# Patient Record
Sex: Female | Born: 1970
Health system: Southern US, Community
[De-identification: ages and names within clinical notes are randomized; demographics above are authoritative.]

## PROBLEM LIST (undated history)

## (undated) DIAGNOSIS — E063 Autoimmune thyroiditis: Secondary | ICD-10-CM

## (undated) DIAGNOSIS — I10 Essential (primary) hypertension: Secondary | ICD-10-CM

## (undated) DIAGNOSIS — O24419 Gestational diabetes mellitus in pregnancy, unspecified control: Secondary | ICD-10-CM

## (undated) DIAGNOSIS — R7303 Prediabetes: Secondary | ICD-10-CM

## (undated) DIAGNOSIS — R51 Headache: Secondary | ICD-10-CM

## (undated) DIAGNOSIS — E049 Nontoxic goiter, unspecified: Secondary | ICD-10-CM

## (undated) DIAGNOSIS — D219 Benign neoplasm of connective and other soft tissue, unspecified: Secondary | ICD-10-CM

## (undated) DIAGNOSIS — N6320 Unspecified lump in the left breast, unspecified quadrant: Secondary | ICD-10-CM

## (undated) DIAGNOSIS — Z8619 Personal history of other infectious and parasitic diseases: Secondary | ICD-10-CM

## (undated) DIAGNOSIS — N6310 Unspecified lump in the right breast, unspecified quadrant: Secondary | ICD-10-CM

## (undated) HISTORY — DX: Autoimmune thyroiditis: E06.3

## (undated) HISTORY — DX: Unspecified lump in the left breast, unspecified quadrant: N63.20

## (undated) HISTORY — DX: Personal history of other infectious and parasitic diseases: Z86.19

## (undated) HISTORY — DX: Essential (primary) hypertension: I10

## (undated) HISTORY — PX: LASIK: SHX215

## (undated) HISTORY — DX: Prediabetes: R73.03

## (undated) HISTORY — DX: Unspecified lump in the right breast, unspecified quadrant: N63.10

## (undated) HISTORY — DX: Benign neoplasm of connective and other soft tissue, unspecified: D21.9

## (undated) HISTORY — DX: Nontoxic goiter, unspecified: E04.9

## (undated) SURGERY — Surgical Case
Anesthesia: *Unknown

---

## 1997-11-30 ENCOUNTER — Other Ambulatory Visit: Admission: RE | Admit: 1997-11-30 | Discharge: 1997-11-30 | Payer: Self-pay | Admitting: Obstetrics and Gynecology

## 1997-12-29 ENCOUNTER — Other Ambulatory Visit: Admission: RE | Admit: 1997-12-29 | Discharge: 1997-12-29 | Payer: Self-pay | Admitting: Obstetrics & Gynecology

## 1997-12-30 ENCOUNTER — Inpatient Hospital Stay (HOSPITAL_COMMUNITY): Admission: AD | Admit: 1997-12-30 | Discharge: 1998-01-01 | Payer: Self-pay | Admitting: Obstetrics & Gynecology

## 1998-01-04 ENCOUNTER — Encounter: Admission: RE | Admit: 1998-01-04 | Discharge: 1998-04-04 | Payer: Self-pay | Admitting: *Deleted

## 1998-06-02 ENCOUNTER — Other Ambulatory Visit: Admission: RE | Admit: 1998-06-02 | Discharge: 1998-06-02 | Payer: Self-pay | Admitting: *Deleted

## 1999-05-24 ENCOUNTER — Other Ambulatory Visit: Admission: RE | Admit: 1999-05-24 | Discharge: 1999-05-24 | Payer: Self-pay | Admitting: Obstetrics & Gynecology

## 2000-06-09 ENCOUNTER — Other Ambulatory Visit: Admission: RE | Admit: 2000-06-09 | Discharge: 2000-06-09 | Payer: Self-pay | Admitting: Obstetrics & Gynecology

## 2001-06-09 ENCOUNTER — Other Ambulatory Visit: Admission: RE | Admit: 2001-06-09 | Discharge: 2001-06-09 | Payer: Self-pay | Admitting: Obstetrics and Gynecology

## 2002-06-15 ENCOUNTER — Other Ambulatory Visit: Admission: RE | Admit: 2002-06-15 | Discharge: 2002-06-15 | Payer: Self-pay | Admitting: Obstetrics and Gynecology

## 2003-02-16 ENCOUNTER — Encounter: Payer: Self-pay | Admitting: Obstetrics and Gynecology

## 2003-02-16 ENCOUNTER — Ambulatory Visit (HOSPITAL_COMMUNITY): Admission: RE | Admit: 2003-02-16 | Discharge: 2003-02-16 | Payer: Self-pay | Admitting: Obstetrics and Gynecology

## 2003-06-17 ENCOUNTER — Other Ambulatory Visit: Admission: RE | Admit: 2003-06-17 | Discharge: 2003-06-17 | Payer: Self-pay | Admitting: Obstetrics and Gynecology

## 2003-08-20 DIAGNOSIS — O24419 Gestational diabetes mellitus in pregnancy, unspecified control: Secondary | ICD-10-CM

## 2003-08-20 HISTORY — DX: Gestational diabetes mellitus in pregnancy, unspecified control: O24.419

## 2003-09-01 ENCOUNTER — Inpatient Hospital Stay (HOSPITAL_COMMUNITY): Admission: AD | Admit: 2003-09-01 | Discharge: 2003-09-01 | Payer: Self-pay | Admitting: Obstetrics and Gynecology

## 2003-09-09 ENCOUNTER — Ambulatory Visit (HOSPITAL_COMMUNITY): Admission: RE | Admit: 2003-09-09 | Discharge: 2003-09-09 | Payer: Self-pay | Admitting: Obstetrics and Gynecology

## 2004-03-20 ENCOUNTER — Encounter: Admission: RE | Admit: 2004-03-20 | Discharge: 2004-04-27 | Payer: Self-pay | Admitting: Obstetrics and Gynecology

## 2004-03-29 ENCOUNTER — Ambulatory Visit (HOSPITAL_COMMUNITY): Admission: RE | Admit: 2004-03-29 | Discharge: 2004-03-29 | Payer: Self-pay | Admitting: Obstetrics and Gynecology

## 2004-04-26 ENCOUNTER — Inpatient Hospital Stay (HOSPITAL_COMMUNITY): Admission: AD | Admit: 2004-04-26 | Discharge: 2004-04-29 | Payer: Self-pay | Admitting: Obstetrics and Gynecology

## 2004-05-24 ENCOUNTER — Encounter: Admission: RE | Admit: 2004-05-24 | Discharge: 2004-05-24 | Payer: Self-pay | Admitting: Obstetrics and Gynecology

## 2004-05-30 DIAGNOSIS — N6310 Unspecified lump in the right breast, unspecified quadrant: Secondary | ICD-10-CM

## 2004-05-30 HISTORY — DX: Unspecified lump in the right breast, unspecified quadrant: N63.10

## 2004-06-18 ENCOUNTER — Other Ambulatory Visit: Admission: RE | Admit: 2004-06-18 | Discharge: 2004-06-18 | Payer: Self-pay | Admitting: Obstetrics and Gynecology

## 2004-06-27 ENCOUNTER — Ambulatory Visit: Payer: Self-pay | Admitting: Family Medicine

## 2004-06-28 ENCOUNTER — Ambulatory Visit: Payer: Self-pay | Admitting: Family Medicine

## 2004-08-23 ENCOUNTER — Encounter: Admission: RE | Admit: 2004-08-23 | Discharge: 2004-08-23 | Payer: Self-pay | Admitting: Obstetrics and Gynecology

## 2004-11-08 ENCOUNTER — Ambulatory Visit: Payer: Self-pay | Admitting: Family Medicine

## 2004-12-04 ENCOUNTER — Ambulatory Visit: Payer: Self-pay | Admitting: Family Medicine

## 2005-01-04 ENCOUNTER — Ambulatory Visit: Payer: Self-pay | Admitting: "Endocrinology

## 2005-04-16 ENCOUNTER — Ambulatory Visit: Payer: Self-pay | Admitting: "Endocrinology

## 2005-06-26 ENCOUNTER — Other Ambulatory Visit: Admission: RE | Admit: 2005-06-26 | Discharge: 2005-06-26 | Payer: Self-pay | Admitting: Obstetrics and Gynecology

## 2005-12-31 ENCOUNTER — Ambulatory Visit: Payer: Self-pay | Admitting: "Endocrinology

## 2006-04-01 ENCOUNTER — Ambulatory Visit: Payer: Self-pay | Admitting: Internal Medicine

## 2006-07-08 ENCOUNTER — Other Ambulatory Visit: Admission: RE | Admit: 2006-07-08 | Discharge: 2006-07-08 | Payer: Self-pay | Admitting: Obstetrics and Gynecology

## 2006-07-08 DIAGNOSIS — N6320 Unspecified lump in the left breast, unspecified quadrant: Secondary | ICD-10-CM

## 2006-07-08 HISTORY — DX: Unspecified lump in the left breast, unspecified quadrant: N63.20

## 2006-07-15 ENCOUNTER — Encounter: Admission: RE | Admit: 2006-07-15 | Discharge: 2006-07-15 | Payer: Self-pay | Admitting: Obstetrics and Gynecology

## 2006-09-09 ENCOUNTER — Ambulatory Visit: Payer: Self-pay | Admitting: "Endocrinology

## 2007-04-15 ENCOUNTER — Ambulatory Visit: Payer: Self-pay | Admitting: "Endocrinology

## 2007-05-28 ENCOUNTER — Ambulatory Visit: Payer: Self-pay | Admitting: Internal Medicine

## 2007-05-28 DIAGNOSIS — O99810 Abnormal glucose complicating pregnancy: Secondary | ICD-10-CM | POA: Insufficient documentation

## 2007-05-28 DIAGNOSIS — I10 Essential (primary) hypertension: Secondary | ICD-10-CM | POA: Insufficient documentation

## 2007-06-01 LAB — CONVERTED CEMR LAB
ALT: 11 units/L (ref 0–35)
AST: 20 units/L (ref 0–37)
Albumin: 4.1 g/dL (ref 3.5–5.2)
Alkaline Phosphatase: 41 units/L (ref 39–117)
BUN: 9 mg/dL (ref 6–23)
Basophils Absolute: 0 10*3/uL (ref 0.0–0.1)
Basophils Relative: 0.2 % (ref 0.0–1.0)
Bilirubin, Direct: 0.1 mg/dL (ref 0.0–0.3)
CO2: 29 meq/L (ref 19–32)
Calcium: 9.3 mg/dL (ref 8.4–10.5)
Chloride: 105 meq/L (ref 96–112)
Cholesterol: 200 mg/dL (ref 0–200)
Creatinine, Ser: 0.4 mg/dL (ref 0.4–1.2)
Eosinophils Absolute: 0.1 10*3/uL (ref 0.0–0.6)
Eosinophils Relative: 2 % (ref 0.0–5.0)
Free T4: 0.7 ng/dL (ref 0.6–1.6)
GFR calc Af Amer: 232 mL/min
GFR calc non Af Amer: 192 mL/min
Glucose, Bld: 96 mg/dL (ref 70–99)
HCT: 37.4 % (ref 36.0–46.0)
HDL: 68.5 mg/dL (ref >39.0)
Hemoglobin: 12.8 g/dL (ref 12.0–15.0)
LDL Cholesterol: 115 mg/dL — ABNORMAL HIGH (ref 0–99)
Lymphocytes Relative: 45.7 % (ref 12.0–46.0)
MCHC: 34.3 g/dL (ref 30.0–36.0)
MCV: 91.2 fL (ref 78.0–100.0)
Monocytes Absolute: 0.4 10*3/uL (ref 0.2–0.7)
Monocytes Relative: 7.9 % (ref 3.0–11.0)
Neutro Abs: 2.2 10*3/uL (ref 1.4–7.7)
Neutrophils Relative %: 44.2 % (ref 43.0–77.0)
Platelets: 283 10*3/uL (ref 150–400)
Potassium: 4.5 meq/L (ref 3.5–5.1)
RBC: 4.11 M/uL (ref 3.87–5.11)
RDW: 11.8 % (ref 11.5–14.6)
Sodium: 140 meq/L (ref 135–145)
T3, Free: 2.9 pg/mL (ref 2.3–4.2)
TSH: 3.23 microintl units/mL (ref 0.35–5.50)
Total Bilirubin: 0.6 mg/dL (ref 0.3–1.2)
Total CHOL/HDL Ratio: 2.9
Total Protein: 7.5 g/dL (ref 6.0–8.3)
Triglycerides: 83 mg/dL (ref 0–149)
VLDL: 17 mg/dL (ref 0–40)
WBC: 4.9 10*3/uL (ref 4.5–10.5)

## 2007-07-27 LAB — CONVERTED CEMR LAB: Pap Smear: NORMAL

## 2007-12-17 ENCOUNTER — Ambulatory Visit: Payer: Self-pay | Admitting: "Endocrinology

## 2008-05-10 ENCOUNTER — Ambulatory Visit: Payer: Self-pay | Admitting: "Endocrinology

## 2008-06-06 ENCOUNTER — Ambulatory Visit: Payer: Self-pay | Admitting: Internal Medicine

## 2008-08-17 ENCOUNTER — Encounter: Payer: Self-pay | Admitting: Internal Medicine

## 2008-08-23 ENCOUNTER — Encounter: Payer: Self-pay | Admitting: Internal Medicine

## 2008-09-01 ENCOUNTER — Ambulatory Visit: Payer: Self-pay | Admitting: "Endocrinology

## 2009-02-28 ENCOUNTER — Ambulatory Visit: Payer: Self-pay | Admitting: "Endocrinology

## 2009-08-21 DIAGNOSIS — D219 Benign neoplasm of connective and other soft tissue, unspecified: Secondary | ICD-10-CM

## 2009-08-21 HISTORY — DX: Benign neoplasm of connective and other soft tissue, unspecified: D21.9

## 2009-09-05 ENCOUNTER — Ambulatory Visit: Payer: Self-pay | Admitting: "Endocrinology

## 2010-03-05 ENCOUNTER — Ambulatory Visit: Payer: Self-pay | Admitting: "Endocrinology

## 2010-06-08 ENCOUNTER — Ambulatory Visit: Payer: Self-pay | Admitting: Internal Medicine

## 2010-06-12 LAB — CONVERTED CEMR LAB
ALT: 11 units/L (ref 0–35)
AST: 22 units/L (ref 0–37)
Albumin: 4.3 g/dL (ref 3.5–5.2)
Alkaline Phosphatase: 50 units/L (ref 39–117)
BUN: 13 mg/dL (ref 6–23)
Basophils Absolute: 0.1 10*3/uL (ref 0.0–0.1)
Basophils Relative: 1.1 % (ref 0.0–3.0)
Bilirubin, Direct: 0.1 mg/dL (ref 0.0–0.3)
CO2: 28 meq/L (ref 19–32)
Calcium: 9.6 mg/dL (ref 8.4–10.5)
Chloride: 103 meq/L (ref 96–112)
Cholesterol: 234 mg/dL — ABNORMAL HIGH (ref 0–200)
Creatinine, Ser: 0.8 mg/dL (ref 0.4–1.2)
Direct LDL: 138.5 mg/dL
Eosinophils Absolute: 0.1 10*3/uL (ref 0.0–0.7)
Eosinophils Relative: 2 % (ref 0.0–5.0)
GFR calc non Af Amer: 103.77 mL/min (ref >60)
Glucose, Bld: 86 mg/dL (ref 70–99)
HCT: 38.3 % (ref 36.0–46.0)
HDL: 77.3 mg/dL (ref >39.00)
Hemoglobin: 13.1 g/dL (ref 12.0–15.0)
Lymphocytes Relative: 40.7 % (ref 12.0–46.0)
Lymphs Abs: 2.1 10*3/uL (ref 0.7–4.0)
MCHC: 34.2 g/dL (ref 30.0–36.0)
MCV: 91.8 fL (ref 78.0–100.0)
Monocytes Absolute: 0.4 10*3/uL (ref 0.1–1.0)
Monocytes Relative: 6.9 % (ref 3.0–12.0)
Neutro Abs: 2.6 10*3/uL (ref 1.4–7.7)
Neutrophils Relative %: 49.3 % (ref 43.0–77.0)
Platelets: 336 10*3/uL (ref 150.0–400.0)
Potassium: 4.6 meq/L (ref 3.5–5.1)
RBC: 4.17 M/uL (ref 3.87–5.11)
RDW: 12.9 % (ref 11.5–14.6)
Sodium: 139 meq/L (ref 135–145)
Total Bilirubin: 0.5 mg/dL (ref 0.3–1.2)
Total CHOL/HDL Ratio: 3
Total Protein: 7.6 g/dL (ref 6.0–8.3)
Triglycerides: 104 mg/dL (ref 0.0–149.0)
VLDL: 20.8 mg/dL (ref 0.0–40.0)
WBC: 5.2 10*3/uL (ref 4.5–10.5)

## 2010-07-09 ENCOUNTER — Ambulatory Visit: Payer: Self-pay | Admitting: "Endocrinology

## 2010-09-03 ENCOUNTER — Ambulatory Visit (HOSPITAL_BASED_OUTPATIENT_CLINIC_OR_DEPARTMENT_OTHER)
Admission: RE | Admit: 2010-09-03 | Discharge: 2010-09-03 | Payer: Self-pay | Source: Home / Self Care | Attending: Internal Medicine | Admitting: Internal Medicine

## 2010-09-03 LAB — HM MAMMOGRAPHY: HM Mammogram: NEGATIVE

## 2010-09-16 LAB — CONVERTED CEMR LAB
Free T4: 0.77 ng/dL
T3, Free: 2.4 pg/mL

## 2010-09-18 NOTE — Assessment & Plan Note (Signed)
Summary: cpx//pt will be fasting//lch   Vital Signs:  Patient profile:   40 year old female Height:      64 inches Weight:      153.38 pounds BMI:     26.42 Pulse rate:   74 / minute Pulse rhythm:   regular BP sitting:   124 / 86  (left arm) Cuff size:   regular  Vitals Entered By: Army Fossa CMA (June 08, 2010 8:49 AM) CC: CPX, fasting  Comments pharm- [facility]    History of Present Illness: CPX  Preventive Screening-Counseling & Management  Caffeine-Diet-Exercise     Does Patient Exercise: yes  Current Medications (verified): 1)  Tri-Sprintec 0.035 Mg  Tabs (Norgestimate-Ethinyl Estradiol) 2)  Nasonex 50 Mcg/act Susp (Mometasone Furoate)  Allergies (verified): No Known Drug Allergies  Past History:  Past Medical History: G3 P2 miscarriage x 1 uterine fibroid tumors 08-2009 h/o Hyperthyroidism h/o Hypertension, on diet only  LASIK 2009  Past Surgical History: LASIK  Family History: Reviewed history from 06/06/2008 and no changes required. colon ca--no breast ca--no DM--GM, aunt MI: gf at 84 y/o  Social History: Married 2 kids tobacco--no ETOH--no Occupation Special educational needs teacher-- low salt, not as well as she likes , thinking about [employer] exercises when she can,  at least 1 or times a week  Does Patient Exercise:  yes  Review of Systems General:  Denies fever and weight loss. CV:  Denies chest pain or discomfort and swelling of feet; h/o HTN, diet only, ambulatory BPs wnl . Resp:  Denies cough and shortness of breath. GI:  Denies bloody stools, nausea, and vomiting. GU:  Denies dysuria and hematuria. Psych:  Denies anxiety and depression.  Physical Exam  General:  alert, well-developed, and well-nourished.   Neck:  no masses, no thyromegaly, and normal carotid upstroke.   Lungs:  normal respiratory effort, no intercostal retractions, no accessory muscle use, and normal breath sounds.   Heart:  normal rate, regular rhythm, no murmur,  and no gallop.   Abdomen:  soft, non-tender, no distention, no masses, no guarding, and no rigidity.   Extremities:  no pretibial edema bilaterally  Psych:  Cognition and judgment appear intact. Alert and cooperative with normal attention span and concentration.  not anxious appearing and not depressed appearing.     Impression & Recommendations:  Problem # 1:  HEALTH SCREENING (ICD-V70.0)  Td 2002 Sees Gyn, they do PAPs, MMGs thyroid problem is followup by endocrinology counseling about diet and exercise, she is thinking about Weight Watchers. Labs Followup in one or 2 years, she is very healthy and sees other doctors  Orders: Venipuncture (16109) TLB-BMP (Basic Metabolic Panel-BMET) (80048-METABOL) TLB-CBC Platelet - w/Differential (85025-CBCD) TLB-Lipid Panel (80061-LIPID) TLB-Hepatic/Liver Function Pnl (80076-HEPATIC) Specimen Handling (60454)  Orders: Venipuncture (09811) TLB-BMP (Basic Metabolic Panel-BMET) (80048-METABOL) TLB-CBC Platelet - w/Differential (85025-CBCD) TLB-Lipid Panel (80061-LIPID) TLB-Hepatic/Liver Function Pnl (80076-HEPATIC) Specimen Handling (91478)  Problem # 2:  HYPERTENSION (ICD-401.9) history of elevated blood pressure, managing with diet only, ambulatory BPs within normal. Doing well  Complete Medication List: 1)  Tri-sprintec 0.035 Mg Tabs (Norgestimate-ethinyl estradiol) 2)  Nasonex 50 Mcg/act Susp (Mometasone furoate)  Patient Instructions: 1)  Please schedule a follow-up appointment in 1 or 2  years   Orders Added: 1)  Venipuncture [36415] 2)  TLB-BMP (Basic Metabolic Panel-BMET) [80048-METABOL] 3)  TLB-CBC Platelet - w/Differential [85025-CBCD] 4)  TLB-Lipid Panel [80061-LIPID] 5)  TLB-Hepatic/Liver Function Pnl [80076-HEPATIC] 6)  Specimen Handling [99000] 7)  Est. Patient age 9-39 [phone]  Immunization History:  Influenza Immunization History:    Influenza:  historical (05/25/2010)   Immunization  History:  Influenza Immunization History:    Influenza:  Historical (05/25/2010)    Risk Factors:  Exercise:  yes

## 2010-12-10 ENCOUNTER — Encounter: Payer: Self-pay | Admitting: *Deleted

## 2011-01-03 ENCOUNTER — Encounter: Payer: Self-pay | Admitting: "Endocrinology

## 2011-01-03 ENCOUNTER — Ambulatory Visit (INDEPENDENT_AMBULATORY_CARE_PROVIDER_SITE_OTHER): Payer: Managed Care, Other (non HMO) | Admitting: "Endocrinology

## 2011-01-03 VITALS — BP 122/84 | HR 71 | Wt 157.0 lb

## 2011-01-03 DIAGNOSIS — E063 Autoimmune thyroiditis: Secondary | ICD-10-CM | POA: Insufficient documentation

## 2011-01-03 DIAGNOSIS — E049 Nontoxic goiter, unspecified: Secondary | ICD-10-CM

## 2011-01-03 DIAGNOSIS — E0580 Other thyrotoxicosis without thyrotoxic crisis or storm: Secondary | ICD-10-CM | POA: Insufficient documentation

## 2011-01-03 DIAGNOSIS — E038 Other specified hypothyroidism: Secondary | ICD-10-CM

## 2011-01-03 DIAGNOSIS — I10 Essential (primary) hypertension: Secondary | ICD-10-CM

## 2011-01-03 MED ORDER — LISINOPRIL 2.5 MG PO TABS: 2.5000 mg | ORAL_TABLET | Freq: Every day | ORAL | Status: DC

## 2011-01-03 NOTE — Patient Instructions (Signed)
Please check BP at work several times per week for the next two weeks. If the diastolic (lower) BP remains above 80, please start lisinopril , 2.5 mg table once a day. Please make a folw-up appointment with Dr. Drue Novel for BP checks.

## 2011-01-03 NOTE — Progress Notes (Signed)
CC: FU thyroiditis, goiter, hyperthyroidism due to flare of thyroiditis, hypothyroidism due to flare of thyroiditis, hypertension  HPI: 40 y.o. African-American woman 1. Wanda Herman has a history of episodically being hyperthyroid or hypothyroid due to flare-ups of Hashimoto's Thyroiditis.   A. I first saw her in consultation on 05.19.06. She had had some hyperthyroid symptoms in the preceding months and had reported them to her then PCP, Dr. Ruthine Dose. Dr. Ruthine Dose ordered TFTs on 04.18.06. The TSH was low at 0.1, the Free T4 was low at 0.7, and the Free T3 was normal at 2.6. Since these results were confusing, Dr. Ruthine Dose referred Wanda Herman to me.  B. She had delivered her second child the preceding September, so she expected to feel as tired as she felt. FH was positive for a first cousin who was taking medication for some thyroid disorder. On exam she looked quite healthy. Her heart rate was 80. She had a 25 gram goiter, with normal being 15-20 grams. She also had trace palmar erythema and 1+ moist palms. She appeared clinically euthyroid, except perhaps for the palmar signs, which might indicate hyperthyroidism or be non-specific. As it turned out, her TFTs that day were euthyroid, with TSH 1.612, FT4 0.99, and FT3 2.4. Her TPO antibody was 113.3, with lab normal < 60 (and real normal <40). The elevated TPO antibody indicated the autoimmune activation of ongoing Hashimoto's Thyroiditis.  C. In the subsequent 6 years I've seen [name] in clinic about once every 6 months. Although she has been euthyroid on most visits, she has also had periods of hyperthyroidism and hypothyroidism associated with flares of Hashimoto's Thyroiditis. She has never required treatment with thionamide medications and is not yet ready to start Synthroid.  2. Her last [facility] visit was on 11.21.11. In the interim she has remained healthy, except for some seasonal allergy symptoms. She takes Zyrtec, prn, and oral contraceptives  regularly. 3.PROS: Constitutional: The patient feels well, is healthy, and has no significant complaints. Eyes: Her vision has been good since her lasik surgery three years ago. There are no significant eye complaints. Neck: The patient has no complaints of anterior neck swelling, soreness, tenderness,  pressure, discomfort, or difficulty swallowing.  Heart: Heart rate increases with exercise or other physical activity. The patient has no complaints of palpitations, irregular heat beats, chest pain, or chest pressure. Gastrointestinal: She will occasionally notice that her stomach feels upset. Bowel movents seem normal. She has no other GI complaints. Legs: Muscle mass and strength seem normal. There are no complaints of numbness, tingling, burning, or pain. No edema is noted. Feet: There are no obvious foot problems. There are no complaints of numbness, tingling, burning, or pain. No edema is noted. GYN: Her LMP began on 04.27.12.  PMFSH: 1. Her marriage and job are going well. 2. She recently resumed exercising again.  ROS: [name] had no other significant complaints involving any of her other eleven body systems.  PHYSICAL EXAM: BP 122/84  Pulse 71  Wt 157 lb (71.215 kg) Constitutional: The patient looks healthy and appears physically and emotionally well.  Eyes: There is no arcus or proptosis.  Mouth: The oropharynx appears normal. The tongue appears normal. There is normal oral moisture. There is no obvious gingivitis. Neck: There are no bruits present. The thyroid gland appears somewhat enlarged in size. The thyroid gland is approximately 20-25 grams in size. The consistency of the thyroid gland is normal. There is no thyroid tenderness to palpation. Lungs: The lungs are  clear. Air movement is good. Heart: The heart rhythm and rate appear normal. Heart sounds S1 and S2 are normal. I do not appreciate any pathologic heart murmurs. Abdomen: The abdominal size is normal. Bowel  sounds are normal. The abdomen is soft and non-tender. There is no obviously palpable hepatomegaly, splenomegaly, or other masses.  Arms: Muscle mass appears appropriate for age.  Hands: There is no obvious tremor. Phalangeal and metacarpophalangeal joints appear normal. Palms are normal. Legs: Muscle mass appears appropriate for age. There is no edema.   Neurologic: Muscle strength is normal for age and gender  in both the upper and the lower extremities. Muscle tone appears normal. Sensation to touch is normal in the legs and feet.   Labs 05.04.12: Normal TFTs (TSH 1.72, FT4 1.03, FT3 2.8), slightly elevated TPO antibody (63)  ASSESSMENT: 1. Goiter: the thyroid gland remains stable in size. 2. Hypothyroid: She is euthyroid without needing medication at present. 3. Thyroiditis: Her Hashimoto's disease is clinically quiescent. 4. Hypertension: Her BPs is somewhat higher despite resuming exercise. I started her on a low dose of lisinopril, 2.5 mg per day. I asked her to follow-up with Dr. Drue Novel in one month to have her BP re-checked.  PLAN: 1. Diagnostic:  A. Lab Tests: TFTs in 6 months.   B. Imaging studies: None 2. Therapeutic:   A. Take one 2.5 mg tablet of lisinopril each morning. Call me if she develops cough or facial swelling.  B. Schedule a FU appointment with Dr. Drue Novel in about one month to have the BP re-checked. 3. Patient education: We discussed how Hashimoto's Dz might make her hypothyroid at some time in the future. 4. Follow-Up: FU appointment in 6 months

## 2011-01-04 NOTE — H&P (Signed)
NAME:  Wanda Herman, Wanda Herman                      ACCOUNT NO.:  [id number]   MEDICAL RECORD NO.:  [id number]                   PATIENT TYPE:  INP   LOCATION:  9113                                 FACILITY:  [facility]   PHYSICIAN:  Osborn Coho, M.D.                DATE OF BIRTH:  08/11/1971   DATE OF ADMISSION:  04/26/2004  DATE OF DISCHARGE:                                HISTORY & PHYSICAL   HISTORY OF PRESENT ILLNESS:  This is a 40 year old, gravida 3, para 1-0-1-1,  at 75 and 3/7ths weeks who presents with contractions every 5 minutes.  She  denies leaking and reports positive fetal movement and peak mucous  discharge.  The pregnancy has been remarkable for (1) history of migraines,  (2) gestational diabetes, (3) group B strep negative.   OBSTETRICAL HISTORY:  Remarkable for a vaginal delivery in 1999 of a female  infant at [redacted] weeks gestation weighing 7 pounds, 14 ounces, remarkable for  meconium fluid and 5 hour labor.  She had a spontaneous abortion in 2004 at  6 weeks with no D&C.   MEDICAL HISTORY:  1.  Remarkable for occasional yeast.  2.  Childhood varicella.  3.  Migraines.   SURGICAL HISTORY:  Remarkable for wisdom teeth at age 39.   FAMILY HISTORY:  Remarkable for mother with hypertension.  Grandmother and  an aunt with diabetes.  Cousin with thyroid dysfunction.  An aunt with  ovarian cancer.  A mother with migraines.   GENETIC HISTORY:  Remarkable for the father of the baby's uncle who has Down  syndrome.   SOCIAL HISTORY:  The patient is married to Sheryle Spray, who is involved  and supportive.  She works as an Airline pilot.  She is of the [place] faith.  She denies any alcohol, tobacco, or drug use.   PRENATAL LABORATORIES:  Hemoglobin 12.8, platelets 308, blood type O  positive, antibody screen negative, sickle cell negative, RPR nonreactive,  rubella immune.  Hepatitis negative.  HIV nonreactive.  Gonorrhea negative.  Chlamydia negative.  Cystic fibrosis  negative.  Group B strep negative.   OBJECTIVE DATA:  VITAL SIGNS:  Stable, afebrile.  HEENT:  Well-nourished.  Thyroid normal and not enlarged.  CHEST:  Clear to auscultation.  HEART:  Regular rate and rhythm.  ABDOMEN:  Gravid at 38 cm, vertex, Leopold's.  EFM shows reactive fetal  heart rate with uterine contractions every 4-5 minutes.  Cervical exam was  initially 2, 85, and -2.  She is now 4, 90, and -1 to -2, vertex.  Intact  membranes.  EXTREMITIES:  Within normal limits.   ASSESSMENT:  1.  Intrauterine pregnancy at 88 and 3/7ths weeks.  2.  Active labor.  3.  Gestational diabetes.  4.  Group B strep negative.   PLAN:  1.  Admit to birthing suites per Dr. Su Hilt.  2.  Routine M.D. orders.  3.  Blood glucose every 2 hours.  4.  Epidural.     Marie L. Williams, C.N.M.                 Osborn Coho, M.D.    MLW/MEDQ  D:  04/26/2004  T:  04/27/2004  Job:  [id number]

## 2011-01-14 ENCOUNTER — Encounter: Payer: Self-pay | Admitting: "Endocrinology

## 2011-03-01 ENCOUNTER — Encounter: Payer: Self-pay | Admitting: Internal Medicine

## 2011-03-01 ENCOUNTER — Ambulatory Visit (INDEPENDENT_AMBULATORY_CARE_PROVIDER_SITE_OTHER): Payer: Managed Care, Other (non HMO) | Admitting: Internal Medicine

## 2011-03-01 DIAGNOSIS — I10 Essential (primary) hypertension: Secondary | ICD-10-CM

## 2011-03-01 MED ORDER — LOSARTAN POTASSIUM 25 MG PO TABS: 25.0000 mg | ORAL_TABLET | Freq: Every day | ORAL | Status: DC

## 2011-03-01 NOTE — Patient Instructions (Signed)
Check the  blood pressure  1 or 2 times a week, be sure it is more than 110/60 and  less than 140/85. If it is consistently higher or lower, let me know Came back in 1 month for labs: BMP---dx HTN

## 2011-03-01 NOTE — Assessment & Plan Note (Signed)
Long history of slightly elevated BP, DBP ~ 2 years ago  was in the 140s/90s. Lately he has been more closely to 120/85. Idea BP is  120/80, diastolic pressure is then slightly elevated.  She has a good lifestyle already. Since she is taking medicines and her diastolic blood pressure is 84, I like her diastolic to be in better, 80 or less. Switch to losartan. See instructions

## 2011-03-01 NOTE — Progress Notes (Signed)
  Subjective:    Patient ID: Wanda Herman, female    DOB: [birth year], 40 y.o.   MRN: [id number]  HPI Blood pressure was recently noted to be 120 / 85, her endocrinologist is a little concerned about the DBP and start lisinopril 2.5 mg daily.  Past Medical History  Diagnosis Date  . Thyroiditis, autoimmune   . Hyperthyroidism with Hashimoto disease   . Hypothyroidism, acquired, autoimmune   . Goiter   . Hypothyroidism, acquired, autoimmune   . Hypertension    Past Surgical History  Procedure Date  . Lasik       Review of Systems No side effect from lisinopril. Good compliance. Diet has improved, has lost 2 pounds in the last couple of months. She remains active. Complain of occasional constipation. No diarrhea or blood in the stools.    Objective:   Physical Exam  Constitutional: She appears well-developed and well-nourished.  Cardiovascular: Normal rate, regular rhythm and normal heart sounds.   No murmur heard. Pulmonary/Chest: Effort normal and breath sounds normal. No respiratory distress. She has no wheezes. She has no rales.  Musculoskeletal: She exhibits no edema.          Assessment & Plan:

## 2011-03-28 ENCOUNTER — Other Ambulatory Visit: Payer: Self-pay | Admitting: Internal Medicine

## 2011-03-29 ENCOUNTER — Other Ambulatory Visit (INDEPENDENT_AMBULATORY_CARE_PROVIDER_SITE_OTHER): Payer: Managed Care, Other (non HMO)

## 2011-03-29 DIAGNOSIS — I10 Essential (primary) hypertension: Secondary | ICD-10-CM

## 2011-03-29 LAB — BASIC METABOLIC PANEL
BUN: 12 mg/dL (ref 6–23)
CO2: 27 mEq/L (ref 19–32)
Calcium: 8.9 mg/dL (ref 8.4–10.5)
Chloride: 107 mEq/L (ref 96–112)
Creatinine, Ser: 0.9 mg/dL (ref 0.4–1.2)
GFR: 93.71 mL/min (ref >60.00)
Glucose, Bld: 111 mg/dL — ABNORMAL HIGH (ref 70–99)
Potassium: 4.4 mEq/L (ref 3.5–5.1)
Sodium: 141 mEq/L (ref 135–145)

## 2011-03-29 NOTE — Progress Notes (Signed)
Labs only

## 2011-04-01 ENCOUNTER — Telehealth: Payer: Self-pay | Admitting: *Deleted

## 2011-04-01 NOTE — Telephone Encounter (Signed)
Message copied by Regis Bill on Mon Apr 01, 2011  5:31 PM ------      Message from: Willow Ora E      Created: Mon Apr 01, 2011  3:43 PM       Advise pt  BMP okay, continue with losartan.      If her BP is well-controlled, come back in 6 months for a routine visit otherwise let me know

## 2011-04-01 NOTE — Telephone Encounter (Signed)
LMOM to inform Pt. 

## 2011-07-04 LAB — TSH: TSH: 2.674 u[IU]/mL (ref 0.350–4.500)

## 2011-07-04 LAB — COMPREHENSIVE METABOLIC PANEL
ALT: 11 U/L (ref 0–35)
AST: 20 U/L (ref 0–37)
Albumin: 4.6 g/dL (ref 3.5–5.2)
Alkaline Phosphatase: 57 U/L (ref 39–117)
BUN: 12 mg/dL (ref 6–23)
CO2: 27 mEq/L (ref 19–32)
Calcium: 9.7 mg/dL (ref 8.4–10.5)
Chloride: 104 mEq/L (ref 96–112)
Creat: 0.69 mg/dL (ref 0.50–1.10)
Glucose, Bld: 105 mg/dL — ABNORMAL HIGH (ref 70–99)
Potassium: 4.2 mEq/L (ref 3.5–5.3)
Sodium: 139 mEq/L (ref 135–145)
Total Bilirubin: 0.3 mg/dL (ref 0.3–1.2)
Total Protein: 7.4 g/dL (ref 6.0–8.3)

## 2011-07-04 LAB — T3, FREE: T3, Free: 2.4 pg/mL (ref 2.3–4.2)

## 2011-07-04 LAB — T4, FREE: Free T4: 1.23 ng/dL (ref 0.80–1.80)

## 2011-07-08 ENCOUNTER — Ambulatory Visit: Payer: Managed Care, Other (non HMO) | Admitting: "Endocrinology

## 2011-07-10 ENCOUNTER — Ambulatory Visit: Payer: Managed Care, Other (non HMO) | Admitting: "Endocrinology

## 2011-07-16 ENCOUNTER — Other Ambulatory Visit: Payer: Self-pay | Admitting: Internal Medicine

## 2011-07-16 ENCOUNTER — Ambulatory Visit (INDEPENDENT_AMBULATORY_CARE_PROVIDER_SITE_OTHER): Payer: Managed Care, Other (non HMO) | Admitting: "Endocrinology

## 2011-07-16 ENCOUNTER — Encounter: Payer: Self-pay | Admitting: "Endocrinology

## 2011-07-16 VITALS — BP 130/87 | HR 75 | Wt 155.0 lb

## 2011-07-16 DIAGNOSIS — E049 Nontoxic goiter, unspecified: Secondary | ICD-10-CM

## 2011-07-16 DIAGNOSIS — I10 Essential (primary) hypertension: Secondary | ICD-10-CM

## 2011-07-16 DIAGNOSIS — E038 Other specified hypothyroidism: Secondary | ICD-10-CM

## 2011-07-16 DIAGNOSIS — E063 Autoimmune thyroiditis: Secondary | ICD-10-CM

## 2011-07-16 NOTE — Progress Notes (Signed)
CC: FU thyroiditis, goiter, transient hyperthyroidism due to flare of thyroiditis, transient hypothyroidism due to flare of thyroiditis, hypertension  HPI: 40 y.o. African-American woman  1. I have been following the patient since 01/04/05 for the above issues. In the subsequent 6 years I've seen Wanda Herman in clinic about once every 6 months. She has had multiple flare-ups of Hashimoto's Thyroiditis over the years that have resulted in her being hyperthyroid (Hashitoxicosis), hypothyroid, and euthyroid in between. Her TSH values have varied from 0.098 4.122. Free T4 values have varied from 0.60-1.23. Free T3 values have varied from 2.2-3.3. TPO antibody levels have always been elevated between  52.2-113.3. Fortunately, she has been euthyroid most of the time, so I have never treated her with Synthroid.  2. Her last [facility] visit was on 05.17.12. In the interim she has remained healthy, except for some seasonal allergy symptoms. She takes Zyrtec, prn, Nasonex, prn, and oral contraceptives regularly. I put her on lisinopril at last visit. Dr. Drue Novel subsequently changed her to losartan. 3.PROS: Constitutional: The patient feels "pretty good", "not a lot of energy, but not really tired". She has been "pretty healthy".  Eyes: Her vision has been good since her lasik surgery three years ago. There are no significant eye complaints. Neck: The patient has no complaints of anterior neck swelling, soreness, tenderness,  pressure, discomfort, or difficulty swallowing.  Heart: Heart rate increases with exercise or other physical activity. The patient has no complaints of palpitations, irregular heat beats, chest pain, or chest pressure. Gastrointestinal: She only rarely notices that her stomach feels upset. Bowel movents seem normal. She has no other GI complaints. Legs: Muscle mass and strength seem normal. There are no complaints of numbness, tingling, burning, or pain. No edema is noted. Feet: There are no obvious  foot problems. There are no complaints of numbness, tingling, burning, or pain. No edema is noted. GYN: Her LMP began on 10.16.12.Her cycle length have been varying for about 18 months, despite OCPs.Marland Kitchen  PMFSH: 1. Work and family: Her marriage and job are going well. 2. Activities: She does 30 minutes of aerobic videos about five times per week.  3. Tobacco, alcohol, and illicit drugs: Never 4. PCP: Dr. Drue Novel  ROS: Wanda Herman has had some serous otitis recently. She has no other significant complaints involving any of her other body systems.  PHYSICAL EXAM: BP 130/87  Pulse 75  Wt 155 lb (70.308 kg) Constitutional: The patient looks healthy and appears physically and emotionally well.  Eyes: There is no arcus or proptosis.  Mouth: The oropharynx appears normal. The tongue appears normal. There is normal oral moisture. There is no obvious gingivitis. Neck: There are no bruits present. The thyroid gland appears somewhat enlarged in size. The thyroid gland is approximately 20+ grams in size. The right lobe is within normal for size and consistency. The left lobe is slightly enlarged. The midportion of the left lobe is firmer. There is no thyroid tenderness to palpation. Lungs: The lungs are clear. Air movement is good. Heart: The heart rhythm and rate appear normal. Heart sounds S1 and S2 are normal. I do not appreciate any pathologic heart murmurs. Abdomen: The abdominal size is normal. Bowel sounds are normal. The abdomen is soft and non-tender. There is no obviously palpable hepatomegaly, splenomegaly, or other masses.  Arms: Muscle mass appears appropriate for age.  Hands: There is no obvious tremor. Phalangeal and metacarpophalangeal joints appear normal. Palms are normal. Legs: Muscle mass appears appropriate for age. There is  no edema.   Neurologic: Muscle strength is normal for age and gender  in both the upper and the lower extremities. Muscle tone appears normal. Sensation to touch is  normal in the legs.   Labs 11.17.12: Normal TFTs (TSH 2.674, FT4 1.23, FT3 2.4)  ASSESSMENT: 1. Goiter: The thyroid gland is slightly smaller today. 2. Hypothyroid: She is euthyroid without needing medication at present. 3. Thyroiditis: Her Hashimoto's disease is clinically quiescent. 4. Hypertension: Her BPs are little higher today than I would've expected. She states that her blood pressures at home are more in the 120/70 range.   PLAN: 1. Diagnostic: TFTs in 6 months. 2. Therapeutic: No Synthroid medication at this time.   3. Patient education: We discussed how Hashimoto's Dz might make her hypothyroid at some time in the future. 4. Follow-Up: FU appointment in 6 months with me. FU with Dr. Drue Novel re BP.

## 2011-07-16 NOTE — Patient Instructions (Signed)
Followup visit with me in 6 months. Please have lab test about one week prior to next visit.

## 2011-07-18 ENCOUNTER — Other Ambulatory Visit: Payer: Self-pay | Admitting: Internal Medicine

## 2011-07-18 DIAGNOSIS — Z1231 Encounter for screening mammogram for malignant neoplasm of breast: Secondary | ICD-10-CM

## 2011-09-09 ENCOUNTER — Ambulatory Visit (HOSPITAL_BASED_OUTPATIENT_CLINIC_OR_DEPARTMENT_OTHER)
Admission: RE | Admit: 2011-09-09 | Discharge: 2011-09-09 | Disposition: A | Discharge status: Home / Self Care | Payer: Managed Care, Other (non HMO) | Source: Ambulatory Visit | Attending: Internal Medicine | Admitting: Internal Medicine

## 2011-09-09 DIAGNOSIS — Z1231 Encounter for screening mammogram for malignant neoplasm of breast: Secondary | ICD-10-CM | POA: Insufficient documentation

## 2011-09-12 ENCOUNTER — Ambulatory Visit: Payer: Managed Care, Other (non HMO) | Admitting: "Endocrinology

## 2011-10-03 ENCOUNTER — Other Ambulatory Visit: Payer: Self-pay | Admitting: Obstetrics and Gynecology

## 2011-10-04 ENCOUNTER — Other Ambulatory Visit: Payer: Self-pay | Admitting: Obstetrics and Gynecology

## 2011-11-08 ENCOUNTER — Encounter (HOSPITAL_COMMUNITY): Payer: Self-pay | Admitting: Pharmacist

## 2011-11-22 ENCOUNTER — Other Ambulatory Visit: Payer: Self-pay

## 2011-11-22 ENCOUNTER — Encounter (HOSPITAL_COMMUNITY)
Admission: RE | Admit: 2011-11-22 | Discharge: 2011-11-22 | Disposition: A | Discharge status: Home / Self Care | Payer: Managed Care, Other (non HMO) | Source: Ambulatory Visit | Attending: Obstetrics and Gynecology | Admitting: Obstetrics and Gynecology

## 2011-11-22 ENCOUNTER — Encounter (HOSPITAL_COMMUNITY): Payer: Self-pay

## 2011-11-22 HISTORY — DX: Gestational diabetes mellitus in pregnancy, unspecified control: O24.419

## 2011-11-22 HISTORY — DX: Headache: R51

## 2011-11-22 LAB — BASIC METABOLIC PANEL
Chloride: 101 mEq/L (ref 96–112)
GFR calc Af Amer: >90 mL/min (ref >90)
GFR calc non Af Amer: >90 mL/min (ref >90)
Potassium: 3.9 mEq/L (ref 3.5–5.1)
Sodium: 136 mEq/L (ref 135–145)

## 2011-11-22 LAB — CBC
MCHC: 33.3 g/dL (ref 30.0–36.0)
Platelets: 307 10*3/uL (ref 150–400)
RDW: 12.9 % (ref 11.5–15.5)
WBC: 5.5 10*3/uL (ref 4.0–10.5)

## 2011-11-22 LAB — SURGICAL PCR SCREEN
MRSA, PCR: NEGATIVE
Staphylococcus aureus: NEGATIVE

## 2011-11-22 NOTE — Patient Instructions (Signed)
YOUR PROCEDURE IS SCHEDULED ON:12/04/11  ENTER THROUGH THE MAIN ENTRANCE OF [facility] AT:0700am  USE DESK PHONE AND DIAL [phone] TO INFORM us OF YOUR ARRIVAL  CALL [phone] IF YOU HAVE ANY QUESTIONS OR PROBLEMS PRIOR TO YOUR ARRIVAL.  REMEMBER: DO NOT EAT OR DRINK AFTER MIDNIGHT :Tuesday  SPECIAL INSTRUCTIONS:   YOU MAY BRUSH YOUR TEETH THE MORNING OF SURGERY   TAKE THESE MEDICINES THE DAY OF SURGERY WITH SIP OF WATER:none   DO NOT WEAR JEWELRY, EYE MAKEUP, LIPSTICK OR DARK FINGERNAIL POLISH DO NOT WEAR LOTIONS  DO NOT SHAVE FOR 48 HOURS PRIOR TO SURGERY  YOU WILL NOT BE ALLOWED TO DRIVE YOURSELF HOME.  NAME OF DRIVER:Brian

## 2011-11-27 ENCOUNTER — Ambulatory Visit (INDEPENDENT_AMBULATORY_CARE_PROVIDER_SITE_OTHER): Payer: Managed Care, Other (non HMO) | Admitting: Obstetrics and Gynecology

## 2011-11-27 ENCOUNTER — Encounter: Payer: Self-pay | Admitting: Obstetrics and Gynecology

## 2011-11-27 VITALS — BP 110/80 | HR 66 | Temp 99.1°F | Resp 16 | Ht 63.0 in | Wt 160.0 lb

## 2011-11-27 DIAGNOSIS — N946 Dysmenorrhea, unspecified: Secondary | ICD-10-CM

## 2011-11-27 DIAGNOSIS — R102 Pelvic and perineal pain: Secondary | ICD-10-CM

## 2011-11-27 DIAGNOSIS — J329 Chronic sinusitis, unspecified: Secondary | ICD-10-CM

## 2011-11-27 DIAGNOSIS — G43909 Migraine, unspecified, not intractable, without status migrainosus: Secondary | ICD-10-CM | POA: Insufficient documentation

## 2011-11-27 DIAGNOSIS — D219 Benign neoplasm of connective and other soft tissue, unspecified: Secondary | ICD-10-CM

## 2011-11-27 DIAGNOSIS — N949 Unspecified condition associated with female genital organs and menstrual cycle: Secondary | ICD-10-CM

## 2011-11-27 DIAGNOSIS — D259 Leiomyoma of uterus, unspecified: Secondary | ICD-10-CM

## 2011-11-27 NOTE — Patient Instructions (Signed)
Follow instructions for Bowel Prep on the day before your surgery. Call with any concerns or questions

## 2011-11-27 NOTE — Progress Notes (Signed)
Subjective:    Wanda Herman is a 41 y.o. MB female scheduled for hysterectomy.. Indications for procedure are symptomatic fibroids, dysmenorrhea and pelvic pain. Pelvic pain has been  worsening over the past year occurring  daily but worse with menses. Pain is sharp, rated at a 7/10 on 10 point pain scale but Wanda Herman doesn't take anything for it. With menses however, the pain is like the labor of childbirth. Additionally she has pelvic pressure that is worse with urination and difficulty starting her urine stream. Denies dyspareunia, discomfort with BMs, dysuria or vaginitis symptoms. Menses is monthly, due to oral contraceptives, lasts 3 days and is very light.  Occasionally Wanda Herman will not have a period but will still experience the severe cramping.  Pelvic ultrasound (09/09/2011) showed a uterus measuring 12.71 x 9.74 x 8.04 cm; endometrium-0.98 cm, subserosal fibroid 7.6 x 5.9 x 5.6 cm and posterior intramural fibroid 3.7 x 2.5 x 3.6 cm, both ovaries appeared normal on that study. A review of both medical and surgical management options were given to Wanda Herman, however, she desires to proceed with definitive therapy in the form of hysterectomy.  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  Objective:    BP 110/80  Pulse 66  Temp(Src) 99.1 F (37.3 C) (Oral)  Resp 16  Ht 5' 3" (1.6 m)  Wt 160 lb  (72.576 kg)  BMI 28.34 kg/m2  LMP 11/22/2011  General:   alert, cooperative and pale  Skin:   normal  HEENT:  PERRLA  Lungs:   clear to auscultation bilaterally  Heart:   regular rate and rhythm  Breasts:    NA  Abdomen:  soft, non-tender, firm mass from pelvis 3 fingers above symphysis  pubis  Pelvis:  EGBUS-wnl, vagina-rugous, cervix-markedly displaced posteriorly, uterus fills the pelvis, is immobile & appears 14-16 weeks size, no tenderness         Assessment: Pelvic Pain   Dysmenorrhea and Symptomatic Uterine Fibroids  Plan: A discussion was held with Wanda Herman regarding the indication for her procedures along with the risks that include, reaction to anesthesia, damage to adjacent organs, infection, excessive bleeding, possible need for an open abdominal incision, early menopause and pelvic support issues. Wanda Herman given a Miralax bowel prep to complete 24 hours prior to surgery.  She has consented to proceed with a Total Laparoscopic Hysterectomy with a possible Laparoscopically Assisted Hysterectomy and a possible Total Abdominal Hysterectomy at [facility] 12/04/2011 @ 8:30 am                     

## 2011-11-27 NOTE — Progress Notes (Signed)
Subjective:    Patient is a 41 y.o. MB female scheduled for hysterectomy.. Indications for procedure are symptomatic fibroids, dysmenorrhea and pelvic pain. Pelvic pain has been  worsening over the past year occurring  daily but worse with menses. Pain is sharp, rated at a 7/10 on 10 point pain scale but patient doesn't take anything for it. With menses however, the pain is like the labor of childbirth. Additionally she has pelvic pressure that is worse with urination and difficulty starting her urine stream. Denies dyspareunia, discomfort with BMs, dysuria or vaginitis symptoms. Menses is monthly, due to oral contraceptives, lasts 3 days and is very light.  Occasionally patient will not have a period but will still experience the severe cramping.  Pelvic ultrasound (09/09/2011) showed a uterus measuring 12.71 x 9.74 x 8.04 cm; endometrium-0.98 cm, subserosal fibroid 7.6 x 5.9 x 5.6 cm and posterior intramural fibroid 3.7 x 2.5 x 3.6 cm, both ovaries appeared normal on that study. A review of both medical and surgical management options were given to patient, however, she desires to proceed with definitive therapy in the form of hysterectomy.  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  Objective:    BP 110/80  Pulse 66  Temp(Src) 99.1 F (37.3 C) (Oral)  Resp 16  Ht 5\' 3"  (1.6 m)  Wt 160 lb  (72.576 kg)  BMI 28.34 kg/m2  LMP 11/22/2011  General:   alert, cooperative and pale  Skin:   normal  HEENT:  PERRLA  Lungs:   clear to auscultation bilaterally  Heart:   regular rate and rhythm  Breasts:    NA  Abdomen:  soft, non-tender, firm mass from pelvis 3 fingers above symphysis  pubis  Pelvis:  EGBUS-wnl, vagina-rugous, cervix-markedly displaced posteriorly, uterus fills the pelvis, is immobile & appears 14-16 weeks size, no tenderness         Assessment: Pelvic Pain   Dysmenorrhea and Symptomatic Uterine Fibroids  Plan: A discussion was held with patient regarding the indication for her procedures along with the risks that include, reaction to anesthesia, damage to adjacent organs, infection, excessive bleeding, possible need for an open abdominal incision, early menopause and pelvic support issues. Patient given a Miralax bowel prep to complete 24 hours prior to surgery.  She has consented to proceed with a Total Laparoscopic Hysterectomy with a possible Laparoscopically Assisted Hysterectomy and a possible Total Abdominal Hysterectomy at [facility] 12/04/2011 @ 8:30 am

## 2011-11-28 NOTE — H&P (Signed)
    Patient is a 41 y.o. MB female scheduled for hysterectomy.. Indications for procedure are symptomatic fibroids, dysmenorrhea and pelvic pain. Pelvic pain has been  worsening over the past year occurring  daily but worse with menses. Pain is sharp, rated at a 7/10 on 10 point pain scale but patient doesn't take anything for it. With menses however, the pain is like the labor of childbirth. Additionally she has pelvic pressure that is worse with urination and difficulty starting her urine stream. Denies dyspareunia, discomfort with BMs, dysuria or vaginitis symptoms. Menses is monthly, due to oral contraceptives, lasts 3 days and is very light.  Occasionally patient will not have a period but will still experience the severe cramping.  Pelvic ultrasound (09/09/2011) showed a uterus measuring 12.71 x 9.74 x 8.04 cm; endometrium-0.98 cm, subserosal fibroid 7.6 x 5.9 x 5.6 cm and posterior intramural fibroid 3.7 x 2.5 x 3.6 cm, both ovaries appeared normal on that study. A review of both medical and surgical management options were given to patient, however, she desires to proceed with definitive therapy in the form of hysterectomy.  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  Objective:    BP 110/80  Pulse 66  Temp(Src) 99.1 F (37.3 C) (Oral)  Resp 16  Ht 5\' 3"  (1.6 m)  Wt 160 lb (84.132  kg)  BMI 28.34 kg/m2  LMP 11/22/2011  General:   alert, cooperative and pale  Skin:   normal  HEENT:  PERRLA  Lungs:   clear to auscultation bilaterally  Heart:   regular rate and rhythm  Breasts:    NA  Abdomen:  soft, non-tender, firm mass from pelvis 3 fingers above symphysis  pubis  Pelvis:  EGBUS-wnl, vagina-rugous, cervix-markedly displaced posteriorly, uterus fills the pelvis, is immobile & appears 14-16 weeks size, no tenderness         Assessment: Pelvic Pain   Dysmenorrhea and Symptomatic Uterine Fibroids  Plan: A discussion was held with patient regarding the indication for her procedures along with the risks that include, reaction to anesthesia, damage to adjacent organs, infection, excessive bleeding, possible need for an open abdominal incision, early menopause and pelvic support issues. Patient given a Miralax bowel prep to complete 24 hours prior to surgery.  She has consented to proceed with a Total Laparoscopic Hysterectomy with a possible Laparoscopically Assisted Hysterectomy and a possible Total Abdominal Hysterectomy at [facility] 12/04/2011 @ 8:30 am                  Ziyon Soltau J. Lowell Guitar, PA-C   ___________________________ Woodroe Mode. Su Hilt, M.D.

## 2011-12-02 MED ORDER — DEXAMETHASONE SODIUM PHOSPHATE 10 MG/ML IJ SOLN
INTRAMUSCULAR | Status: AC
  Filled 2011-12-02: qty 1

## 2011-12-02 MED ORDER — MIDAZOLAM HCL 2 MG/2ML IJ SOLN
INTRAMUSCULAR | Status: AC
  Filled 2011-12-02: qty 2

## 2011-12-02 MED ORDER — FENTANYL CITRATE 0.05 MG/ML IJ SOLN
INTRAMUSCULAR | Status: AC
  Filled 2011-12-02: qty 2

## 2011-12-02 MED ORDER — KETOROLAC TROMETHAMINE 60 MG/2ML IM SOLN
INTRAMUSCULAR | Status: AC
  Filled 2011-12-02: qty 2

## 2011-12-02 MED ORDER — PROPOFOL 10 MG/ML IV EMUL
INTRAVENOUS | Status: AC
  Filled 2011-12-02: qty 20

## 2011-12-02 MED ORDER — LIDOCAINE HCL (CARDIAC) 20 MG/ML IV SOLN
INTRAVENOUS | Status: AC
  Filled 2011-12-02: qty 5

## 2011-12-02 MED ORDER — GLYCOPYRROLATE 0.2 MG/ML IJ SOLN
INTRAMUSCULAR | Status: AC
  Filled 2011-12-02: qty 1

## 2011-12-02 MED ORDER — ONDANSETRON HCL 4 MG/2ML IJ SOLN
INTRAMUSCULAR | Status: AC
  Filled 2011-12-02: qty 2

## 2011-12-03 ENCOUNTER — Other Ambulatory Visit: Payer: Self-pay | Admitting: Obstetrics and Gynecology

## 2011-12-03 ENCOUNTER — Encounter: Payer: Self-pay | Admitting: Obstetrics and Gynecology

## 2011-12-03 MED ORDER — DEXTROSE 5 % IV SOLN
1.0000 g | INTRAVENOUS | Status: AC
  Administered 2011-12-04: 1 g via INTRAVENOUS
  Filled 2011-12-03: qty 1

## 2011-12-03 NOTE — Progress Notes (Signed)
TLH possible LAVH possible TAH scheduled for 12/04/15 @ 8:30 with AR/ND.  [employer] effecticve 08/20/07.  Plan pays 80/20 after a $500. Deductible.  Pre-op due $244.55  -ap

## 2011-12-04 ENCOUNTER — Encounter (HOSPITAL_COMMUNITY): Payer: Self-pay | Admitting: *Deleted

## 2011-12-04 ENCOUNTER — Inpatient Hospital Stay (HOSPITAL_COMMUNITY)
Admission: RE | Admit: 2011-12-04 | Discharge: 2011-12-06 | DRG: 743 | Disposition: A | Discharge status: Home / Self Care | Payer: Managed Care, Other (non HMO) | Source: Ambulatory Visit | Attending: Obstetrics and Gynecology | Admitting: Obstetrics and Gynecology

## 2011-12-04 ENCOUNTER — Encounter (HOSPITAL_COMMUNITY): Payer: Self-pay | Admitting: Registered Nurse

## 2011-12-04 ENCOUNTER — Encounter (HOSPITAL_COMMUNITY)
Admission: RE | Disposition: A | Discharge status: Home / Self Care | Payer: Self-pay | Source: Ambulatory Visit | Attending: Obstetrics and Gynecology

## 2011-12-04 ENCOUNTER — Ambulatory Visit (HOSPITAL_COMMUNITY): Payer: Managed Care, Other (non HMO) | Admitting: Registered Nurse

## 2011-12-04 DIAGNOSIS — Z9889 Other specified postprocedural states: Secondary | ICD-10-CM

## 2011-12-04 DIAGNOSIS — D252 Subserosal leiomyoma of uterus: Secondary | ICD-10-CM | POA: Diagnosis present

## 2011-12-04 DIAGNOSIS — I10 Essential (primary) hypertension: Secondary | ICD-10-CM | POA: Diagnosis present

## 2011-12-04 DIAGNOSIS — N949 Unspecified condition associated with female genital organs and menstrual cycle: Secondary | ICD-10-CM | POA: Diagnosis present

## 2011-12-04 DIAGNOSIS — D259 Leiomyoma of uterus, unspecified: Secondary | ICD-10-CM

## 2011-12-04 DIAGNOSIS — D649 Anemia, unspecified: Secondary | ICD-10-CM | POA: Diagnosis not present

## 2011-12-04 DIAGNOSIS — N946 Dysmenorrhea, unspecified: Secondary | ICD-10-CM | POA: Diagnosis present

## 2011-12-04 DIAGNOSIS — D251 Intramural leiomyoma of uterus: Principal | ICD-10-CM | POA: Diagnosis present

## 2011-12-04 DIAGNOSIS — Z90710 Acquired absence of both cervix and uterus: Secondary | ICD-10-CM | POA: Diagnosis not present

## 2011-12-04 HISTORY — PX: ABDOMINAL HYSTERECTOMY: SHX81

## 2011-12-04 HISTORY — PX: CYSTOSCOPY: SHX5120

## 2011-12-04 LAB — HCG, SERUM, QUALITATIVE: Preg, Serum: NEGATIVE

## 2011-12-04 SURGERY — HYSTERECTOMY, ABDOMINAL
Anesthesia: General | Site: Bladder | Wound class: Clean Contaminated

## 2011-12-04 MED ORDER — ROCURONIUM BROMIDE 50 MG/5ML IV SOLN
INTRAVENOUS | Status: AC
  Filled 2011-12-04: qty 1

## 2011-12-04 MED ORDER — HYDROMORPHONE 0.3 MG/ML IV SOLN
INTRAVENOUS | Status: AC
  Filled 2011-12-04: qty 25

## 2011-12-04 MED ORDER — DEXAMETHASONE SODIUM PHOSPHATE 10 MG/ML IJ SOLN
INTRAMUSCULAR | Status: AC
  Filled 2011-12-04: qty 1

## 2011-12-04 MED ORDER — METOCLOPRAMIDE HCL 5 MG/ML IJ SOLN
10.0000 mg | Freq: Four times a day (QID) | INTRAMUSCULAR | Status: DC | PRN
  Administered 2011-12-04: 10 mg via INTRAVENOUS
  Filled 2011-12-04: qty 2

## 2011-12-04 MED ORDER — FENTANYL CITRATE 0.05 MG/ML IJ SOLN
INTRAMUSCULAR | Status: AC
  Filled 2011-12-04: qty 5

## 2011-12-04 MED ORDER — DEXAMETHASONE SODIUM PHOSPHATE 10 MG/ML IJ SOLN
INTRAMUSCULAR | Status: DC | PRN
  Administered 2011-12-04: 10 mg via INTRAVENOUS

## 2011-12-04 MED ORDER — PROPOFOL 10 MG/ML IV EMUL
INTRAVENOUS | Status: AC
  Filled 2011-12-04: qty 20

## 2011-12-04 MED ORDER — DIPHENHYDRAMINE HCL 12.5 MG/5ML PO ELIX: 12.5000 mg | ORAL_SOLUTION | Freq: Four times a day (QID) | ORAL | Status: DC | PRN

## 2011-12-04 MED ORDER — LIDOCAINE HCL (CARDIAC) 20 MG/ML IV SOLN
INTRAVENOUS | Status: DC | PRN
  Administered 2011-12-04: 60 mg via INTRAVENOUS

## 2011-12-04 MED ORDER — INDIGOTINDISULFONATE SODIUM 8 MG/ML IJ SOLN
INTRAMUSCULAR | Status: DC | PRN
  Administered 2011-12-04: 5 mL via INTRAVENOUS

## 2011-12-04 MED ORDER — NEOSTIGMINE METHYLSULFATE 1 MG/ML IJ SOLN
INTRAMUSCULAR | Status: AC
  Filled 2011-12-04: qty 10

## 2011-12-04 MED ORDER — KETOROLAC TROMETHAMINE 30 MG/ML IJ SOLN
INTRAMUSCULAR | Status: AC
  Filled 2011-12-04: qty 1

## 2011-12-04 MED ORDER — VASOPRESSIN 20 UNIT/ML IJ SOLN
INTRAVENOUS | Status: DC | PRN
  Administered 2011-12-04 (×2): via INTRAMUSCULAR

## 2011-12-04 MED ORDER — KETOROLAC TROMETHAMINE 30 MG/ML IJ SOLN
INTRAMUSCULAR | Status: DC | PRN
  Administered 2011-12-04: 30 mg via INTRAVENOUS

## 2011-12-04 MED ORDER — HYDROMORPHONE HCL PF 1 MG/ML IJ SOLN
INTRAMUSCULAR | Status: DC | PRN
  Administered 2011-12-04 (×2): 0.5 mg via INTRAVENOUS

## 2011-12-04 MED ORDER — BUPIVACAINE HCL (PF) 0.25 % IJ SOLN
INTRAMUSCULAR | Status: AC
  Filled 2011-12-04: qty 60

## 2011-12-04 MED ORDER — FENTANYL CITRATE 0.05 MG/ML IJ SOLN
INTRAMUSCULAR | Status: DC | PRN
  Administered 2011-12-04 (×5): 50 ug via INTRAVENOUS
  Administered 2011-12-04: 150 ug via INTRAVENOUS
  Administered 2011-12-04: 50 ug via INTRAVENOUS

## 2011-12-04 MED ORDER — LACTATED RINGERS IV SOLN
INTRAVENOUS | Status: DC
  Administered 2011-12-04 (×4): via INTRAVENOUS

## 2011-12-04 MED ORDER — HYDROMORPHONE HCL PF 1 MG/ML IJ SOLN
INTRAMUSCULAR | Status: AC
  Filled 2011-12-04: qty 1

## 2011-12-04 MED ORDER — NALOXONE HCL 0.4 MG/ML IJ SOLN: 0.4000 mg | INTRAMUSCULAR | Status: DC | PRN

## 2011-12-04 MED ORDER — PROPOFOL 10 MG/ML IV EMUL
INTRAVENOUS | Status: DC | PRN
  Administered 2011-12-04: 170 mg via INTRAVENOUS

## 2011-12-04 MED ORDER — ONDANSETRON HCL 4 MG/2ML IJ SOLN
INTRAMUSCULAR | Status: DC | PRN
  Administered 2011-12-04: 4 mg via INTRAVENOUS

## 2011-12-04 MED ORDER — ROCURONIUM BROMIDE 100 MG/10ML IV SOLN
INTRAVENOUS | Status: DC | PRN
  Administered 2011-12-04 (×2): 10 mg via INTRAVENOUS
  Administered 2011-12-04: 50 mg via INTRAVENOUS

## 2011-12-04 MED ORDER — SODIUM CHLORIDE 0.9 % IJ SOLN: 9.0000 mL | INTRAMUSCULAR | Status: DC | PRN

## 2011-12-04 MED ORDER — HYDROMORPHONE 0.3 MG/ML IV SOLN
INTRAVENOUS | Status: DC
  Administered 2011-12-04: 14:00:00 via INTRAVENOUS
  Administered 2011-12-04: 0.4 mg via INTRAVENOUS
  Administered 2011-12-04: 1.99 mg via INTRAVENOUS

## 2011-12-04 MED ORDER — VASOPRESSIN 20 UNIT/ML IJ SOLN
INTRAMUSCULAR | Status: AC
  Filled 2011-12-04: qty 1

## 2011-12-04 MED ORDER — INDIGOTINDISULFONATE SODIUM 8 MG/ML IJ SOLN
INTRAMUSCULAR | Status: AC
  Filled 2011-12-04: qty 5

## 2011-12-04 MED ORDER — GLYCOPYRROLATE 0.2 MG/ML IJ SOLN
INTRAMUSCULAR | Status: AC
  Filled 2011-12-04: qty 1

## 2011-12-04 MED ORDER — LIDOCAINE HCL (CARDIAC) 20 MG/ML IV SOLN
INTRAVENOUS | Status: AC
  Filled 2011-12-04: qty 5

## 2011-12-04 MED ORDER — PROMETHAZINE HCL 25 MG/ML IJ SOLN: 25.0000 mg | Freq: Four times a day (QID) | INTRAMUSCULAR | Status: DC | PRN

## 2011-12-04 MED ORDER — GLYCOPYRROLATE 0.2 MG/ML IJ SOLN
INTRAMUSCULAR | Status: DC | PRN
  Administered 2011-12-04: 0.2 mg via INTRAVENOUS
  Administered 2011-12-04: 0.3 mg via INTRAVENOUS

## 2011-12-04 MED ORDER — ONDANSETRON HCL 4 MG/2ML IJ SOLN
INTRAMUSCULAR | Status: AC
  Filled 2011-12-04: qty 2

## 2011-12-04 MED ORDER — DIPHENHYDRAMINE HCL 50 MG/ML IJ SOLN: 12.5000 mg | Freq: Four times a day (QID) | INTRAMUSCULAR | Status: DC | PRN

## 2011-12-04 MED ORDER — MIDAZOLAM HCL 5 MG/5ML IJ SOLN
INTRAMUSCULAR | Status: DC | PRN
  Administered 2011-12-04: 2 mg via INTRAVENOUS

## 2011-12-04 MED ORDER — NEOSTIGMINE METHYLSULFATE 1 MG/ML IJ SOLN
INTRAMUSCULAR | Status: DC | PRN
  Administered 2011-12-04: 2 mg via INTRAVENOUS

## 2011-12-04 MED ORDER — LACTATED RINGERS IV SOLN
INTRAVENOUS | Status: DC
  Administered 2011-12-04 – 2011-12-05 (×2): via INTRAVENOUS

## 2011-12-04 MED ORDER — ONDANSETRON HCL 4 MG/2ML IJ SOLN
4.0000 mg | Freq: Four times a day (QID) | INTRAMUSCULAR | Status: DC | PRN
  Administered 2011-12-04: 4 mg via INTRAVENOUS
  Filled 2011-12-04: qty 2

## 2011-12-04 MED ORDER — ESTRADIOL 0.1 MG/GM VA CREA
TOPICAL_CREAM | VAGINAL | Status: AC
  Filled 2011-12-04: qty 42.5

## 2011-12-04 MED ORDER — MIDAZOLAM HCL 2 MG/2ML IJ SOLN
INTRAMUSCULAR | Status: AC
  Filled 2011-12-04: qty 2

## 2011-12-04 MED ORDER — HYDROMORPHONE HCL PF 1 MG/ML IJ SOLN: 0.2500 mg | INTRAMUSCULAR | Status: DC | PRN

## 2011-12-04 SURGICAL SUPPLY — 86 items
BARRIER ADHS 3X4 INTERCEED (GAUZE/BANDAGES/DRESSINGS) IMPLANT
CABLE HIGH FREQUENCY MONO STRZ (ELECTRODE) IMPLANT
CANISTER SUCTION 2500CC (MISCELLANEOUS) ×4 IMPLANT
CATH ROBINSON RED A/P 16FR (CATHETERS) IMPLANT
CHLORAPREP W/TINT 26ML (MISCELLANEOUS) ×4 IMPLANT
CLOTH BEACON ORANGE TIMEOUT ST (SAFETY) ×4 IMPLANT
CONT PATH 16OZ SNAP LID 3702 (MISCELLANEOUS) ×4 IMPLANT
COVER MAYO STAND STRL (DRAPES) ×4 IMPLANT
COVER TABLE BACK 60X90 (DRAPES) ×4 IMPLANT
DECANTER SPIKE VIAL GLASS SM (MISCELLANEOUS) IMPLANT
DERMABOND ADVANCED (GAUZE/BANDAGES/DRESSINGS) ×1
DERMABOND ADVANCED .7 DNX12 (GAUZE/BANDAGES/DRESSINGS) ×3 IMPLANT
DISSECTOR BLUNT TIP ENDO 5MM (MISCELLANEOUS) IMPLANT
DISSECTOR SPONGE CHERRY (GAUZE/BANDAGES/DRESSINGS) IMPLANT
DRAPE HYSTEROSCOPY (DRAPE) IMPLANT
DRAPE PROXIMA HALF (DRAPES) IMPLANT
DRESSING TELFA 8X3 (GAUZE/BANDAGES/DRESSINGS) ×8 IMPLANT
ELECT REM PT RETURN 9FT ADLT (ELECTROSURGICAL) ×4
ELECTRODE REM PT RTRN 9FT ADLT (ELECTROSURGICAL) ×3 IMPLANT
EVACUATOR SMOKE 8.L (FILTER) ×8 IMPLANT
FORCEPS CUTTING 33CM 5MM (CUTTING FORCEPS) IMPLANT
GAUZE PACKING 2X5 YD STERILE (GAUZE/BANDAGES/DRESSINGS) IMPLANT
GAUZE SPONGE 4X4 16PLY XRAY LF (GAUZE/BANDAGES/DRESSINGS) IMPLANT
GLOVE BIO SURGEON STRL SZ7.5 (GLOVE) ×8 IMPLANT
GLOVE BIOGEL PI IND STRL 7.5 (GLOVE) ×3 IMPLANT
GLOVE BIOGEL PI INDICATOR 7.5 (GLOVE) ×1
GOWN PREVENTION PLUS LG XLONG (DISPOSABLE) ×12 IMPLANT
HEMOSTAT SURGICEL 2X14 (HEMOSTASIS) ×8 IMPLANT
HEMOSTAT SURGICEL 4X8 (HEMOSTASIS) IMPLANT
NEEDLE HYPO 25X1 1.5 SAFETY (NEEDLE) IMPLANT
NEEDLE INSUFFLATION 14GA 120MM (NEEDLE) ×8 IMPLANT
NEEDLE MAYO .5 CIRCLE (NEEDLE) IMPLANT
NS IRRIG 1000ML POUR BTL (IV SOLUTION) ×4 IMPLANT
OCCLUDER COLPOPNEUMO (BALLOONS) ×4 IMPLANT
PACK LAPAROSCOPY BASIN (CUSTOM PROCEDURE TRAY) ×4 IMPLANT
PACK LAVH (CUSTOM PROCEDURE TRAY) ×4 IMPLANT
PAD ABD 7.5X8 STRL (GAUZE/BANDAGES/DRESSINGS) ×4 IMPLANT
PAD OB MATERNITY 4.3X12.25 (PERSONAL CARE ITEMS) ×4 IMPLANT
PROTECTOR NERVE ULNAR (MISCELLANEOUS) ×8 IMPLANT
SCALPEL HARMONIC ACE (MISCELLANEOUS) IMPLANT
SCISSORS LAP 5X35 DISP (ENDOMECHANICALS) IMPLANT
SET CYSTO W/LG BORE CLAMP LF (SET/KITS/TRAYS/PACK) ×4 IMPLANT
SET IRRIG TUBING LAPAROSCOPIC (IRRIGATION / IRRIGATOR) ×4 IMPLANT
SLEEVE ADV FIXATION 5X100MM (TROCAR) ×8 IMPLANT
SOLUTION ELECTROLUBE (MISCELLANEOUS) IMPLANT
SPONGE LAP 18X18 X RAY DECT (DISPOSABLE) ×8 IMPLANT
STAPLER VISISTAT 35W (STAPLE) IMPLANT
STRIP CLOSURE SKIN 1/4X3 (GAUZE/BANDAGES/DRESSINGS) IMPLANT
STRIP CLOSURE SKIN 1/4X4 (GAUZE/BANDAGES/DRESSINGS) IMPLANT
SUT CHROMIC 2 0 CT 1 (SUTURE) ×8 IMPLANT
SUT CHROMIC 2 0 SH (SUTURE) ×8 IMPLANT
SUT CHROMIC 2 0 TIES 18 (SUTURE) IMPLANT
SUT MNCRL AB 3-0 PS2 27 (SUTURE) ×12 IMPLANT
SUT MON AB 3-0 SH 27 (SUTURE)
SUT MON AB 3-0 SH27 (SUTURE) IMPLANT
SUT MON AB 4-0 PS1 27 (SUTURE) ×4 IMPLANT
SUT PDS AB 1 CT1 36 (SUTURE) IMPLANT
SUT PDS AB 1 CTX 36 (SUTURE) IMPLANT
SUT PLAIN 2 0 XLH (SUTURE) ×4 IMPLANT
SUT VIC AB 0 CT1 18XCR BRD8 (SUTURE) ×9 IMPLANT
SUT VIC AB 0 CT1 27 (SUTURE)
SUT VIC AB 0 CT1 27XBRD ANBCTR (SUTURE) IMPLANT
SUT VIC AB 0 CT1 36 (SUTURE) ×16 IMPLANT
SUT VIC AB 0 CT1 8-18 (SUTURE) ×3
SUT VIC AB 3-0 CT1 27 (SUTURE) ×2
SUT VIC AB 3-0 CT1 TAPERPNT 27 (SUTURE) ×6 IMPLANT
SUT VIC AB 3-0 SH 27 (SUTURE) ×1
SUT VIC AB 3-0 SH 27X BRD (SUTURE) ×3 IMPLANT
SUT VICRYL 0 TIES 12 18 (SUTURE) ×4 IMPLANT
SUT VICRYL 0 UR6 27IN ABS (SUTURE) ×8 IMPLANT
SYR 50ML LL SCALE MARK (SYRINGE) ×4 IMPLANT
SYR CONTROL 10ML LL (SYRINGE) IMPLANT
SYR TB 1ML LUER SLIP (SYRINGE) ×4 IMPLANT
TAPE CLOTH SURG 4X10 WHT LF (GAUZE/BANDAGES/DRESSINGS) ×4 IMPLANT
TIP UTERINE 5.1X6CM LAV DISP (MISCELLANEOUS) IMPLANT
TIP UTERINE 6.7X10CM GRN DISP (MISCELLANEOUS) IMPLANT
TIP UTERINE 6.7X6CM WHT DISP (MISCELLANEOUS) IMPLANT
TIP UTERINE 6.7X8CM BLUE DISP (MISCELLANEOUS) IMPLANT
TOWEL OR 17X24 6PK STRL BLUE (TOWEL DISPOSABLE) ×8 IMPLANT
TRAY FOLEY CATH 14FR (SET/KITS/TRAYS/PACK) ×4 IMPLANT
TROCAR BALLN 12MMX100 BLUNT (TROCAR) IMPLANT
TROCAR Z-THREAD FIOS 11X100 BL (TROCAR) ×8 IMPLANT
TROCAR Z-THREAD FIOS 5X100MM (TROCAR) ×4 IMPLANT
TUBING FILTER THERMOFLATOR (ELECTROSURGICAL) ×4 IMPLANT
WARMER LAPAROSCOPE (MISCELLANEOUS) ×4 IMPLANT
WATER STERILE IRR 1000ML POUR (IV SOLUTION) ×4 IMPLANT

## 2011-12-04 NOTE — H&P (View-Only) (Signed)
    Patient is a 41 y.o. MB female scheduled for hysterectomy.. Indications for procedure are symptomatic fibroids, dysmenorrhea and pelvic pain. Pelvic pain has been  worsening over the past year occurring  daily but worse with menses. Pain is sharp, rated at a 7/10 on 10 point pain scale but patient doesn't take anything for it. With menses however, the pain is like the labor of childbirth. Additionally she has pelvic pressure that is worse with urination and difficulty starting her urine stream. Denies dyspareunia, discomfort with BMs, dysuria or vaginitis symptoms. Menses is monthly, due to oral contraceptives, lasts 3 days and is very light.  Occasionally patient will not have a period but will still experience the severe cramping.  Pelvic ultrasound (09/09/2011) showed a uterus measuring 12.71 x 9.74 x 8.04 cm; endometrium-0.98 cm, subserosal fibroid 7.6 x 5.9 x 5.6 cm and posterior intramural fibroid 3.7 x 2.5 x 3.6 cm, both ovaries appeared normal on that study. A review of both medical and surgical management options were given to patient, however, she desires to proceed with definitive therapy in the form of hysterectomy.  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  Objective:    BP 110/80  Pulse 66  Temp(Src) 99.1 F (37.3 C) (Oral)  Resp 16  Ht 5' 3" (1.6 m)  Wt 160 lb (72.576  kg)  BMI 28.34 kg/m2  LMP 11/22/2011  General:   alert, cooperative and pale  Skin:   normal  HEENT:  PERRLA  Lungs:   clear to auscultation bilaterally  Heart:   regular rate and rhythm  Breasts:    NA  Abdomen:  soft, non-tender, firm mass from pelvis 3 fingers above symphysis  pubis  Pelvis:  EGBUS-wnl, vagina-rugous, cervix-markedly displaced posteriorly, uterus fills the pelvis, is immobile & appears 14-16 weeks size, no tenderness         Assessment: Pelvic Pain   Dysmenorrhea and Symptomatic Uterine Fibroids  Plan: A discussion was held with patient regarding the indication for her procedures along with the risks that include, reaction to anesthesia, damage to adjacent organs, infection, excessive bleeding, possible need for an open abdominal incision, early menopause and pelvic support issues. Patient given a Miralax bowel prep to complete 24 hours prior to surgery.  She has consented to proceed with a Total Laparoscopic Hysterectomy with a possible Laparoscopically Assisted Hysterectomy and a possible Total Abdominal Hysterectomy at [facility] 12/04/2011 @ 8:30 am                  Real Cona J. Cyril Woodmansee, PA-C   ___________________________ Angela Y. Roberts, M.D.   

## 2011-12-04 NOTE — Op Note (Signed)
Preop Diagnosis: Symtomatic Fibriods   Postop Diagnosis: Symtomatic Fibriods   Procedure: TOTAL ABDOMINAL HYSTERECTOMY and CYSTOSCOPY  Anesthesia: General   Anesthesiologist: Dr. Dana Allan   Attending: Purcell Nails, MD   Assistant: Jaymes Graff, MD  Findings: Normal bilateral ovaries and tubes.  Large approximately 12cm posterior fibroid.  Pathology: Uterus and cervix weighing 829.6g  Fluids: 3300 cc  UOP: 400 cc  EBL: 400 cc  Complications: None  Procedure: The patient received intravenous antibiotics and had sequential compression devices applied to her lower extremities while in the preoperative area.   She was taken to the operating room and placed under general anesthesia without difficulty.The abdomen and perineum were prepped and draped in a sterile manner, and she was placed in a dorsal supine position.  A Foley catheter was inserted into the bladder and attached to constant drainage. After an adequate timeout was performed, a Pfannensteil skin incision was made. This incision was taken down to the fascia using electrocautery with care given to maintain good hemostasis. The fascia was incised in the midline and the fascial incision was then extended bilaterally using electrocautery without difficulty. The fascia was then dissected off the underlying rectus muscles using blunt and sharp dissection. The rectus muscles were split bluntly in the midline and the peritoneum entered sharply without complication. This peritoneal incision was then extended superiorly and inferiorly with care given to prevent bowel or bladder injury. Attention was then turned to the pelvis. A retractor was placed into the incision, and the bowel was packed away with moist laparotomy sponges. The uterus at this point was elevated out of the abdomen as much as possible but large posterior fibroid impaired complete mobilization.  The bowel was packed away with moist laparotomy sponges. The round  ligaments on each side were clamped, suture ligated with 0 Vicryl, and transected with electrocautery allowing entry into the broad ligament. Of note, all sutures used in this procedure are 0 Vicryl unless otherwise noted. The anterior and posterior leaves of the broad ligament were separated, and the ureters were inspected to be safely away from the area of dissection bilaterally.  Adnexae were clamped on the patient's left side, cut, and doubly suture ligated. This procedure was repeated in an identical fashion on the right side allowing for both adnexa to remain in place.  A bladder flap was then created.  The bladder was then bluntly dissected off the lower uterine segment and cervix with good hemostasis noted. It was still impossible to elevated the uterus out of the abdomen for skeletonization of the uterine vessels.  Myomectomy of large posterior fibroid was then performed after injecting with dilute pitressin (20cc in 100cc NS).  The uterine arteries were then skeletonized bilaterally and then clamped, cut, and doubly suture ligated with care given to prevent ureteral injury.  The uterosacral ligaments were then clamped, cut, and ligated bilaterally.  Finally, the cardinal ligaments were clamped, cut, and ligated bilaterally.  Acutely curved clamps were placed across the vagina just under the cervix, and the specimen was amputated and sent to pathology. The vaginal cuff angles were closed with Heaney stiches with care given to incorporate the uterosacral-cardinal ligament pedicles on both sides. The middle of the vaginal cuff was closed with interrupted figure-of-eight sutures with care given to incorporate the anterior pubocervical fascia and the posterior rectovaginal fascia.   The pelvis was irrigated and hemostasis was reconfirmed at all pedicles and along the pelvic sidewall.  Surgicel was placed to help maintain hemostasis as  the pt was oozy throughout the procedure.  All laparotomy sponges and  instruments were removed from the abdomen. The peritoneum was closed with a running stitch of 2-0 chromic, and the fascia was also closed in a running fashion using 0 vicryl. The subcutaneous layer was reapproximated with 2-0 plain gut. The skin was closed with a 3-0 monocryl via a subcuticular stitch.  Indigo carmine was administered by CRNA.  Attention was then turned to the perineum where the foley was removed, the urethra prepped with betadine and cystoscopy performed without difficulty.  Bilateral ureters were noted to efflux normally and there were no bladder injuries. Sponge, lap, needle, and instrument counts were correct times two. The patient was taken to the recovery area awake, extubated and in stable condition.

## 2011-12-04 NOTE — Anesthesia Postprocedure Evaluation (Signed)
Anesthesia Post Note  Patient: Wanda Herman  Procedure(s) Performed: Procedure(s) (LRB): HYSTERECTOMY ABDOMINAL (N/A) CYSTOSCOPY (N/A)  Anesthesia type: General  Patient location: PACU  Post pain: Pain level controlled  Post assessment: Post-op Vital signs reviewed  Last Vitals:  Filed Vitals:   12/04/11 1300  BP: 122/56  Pulse: 86  Temp:   Resp: 16    Post vital signs: Reviewed  Level of consciousness: sedated  Complications: No apparent anesthesia complicationsfj

## 2011-12-04 NOTE — Anesthesia Postprocedure Evaluation (Signed)
  Anesthesia Post-op Note  Patient: Wanda Herman  Procedure(s) Performed: Procedure(s) (LRB): HYSTERECTOMY ABDOMINAL (N/A) CYSTOSCOPY (N/A)  [facility] Location: [facility]  Anesthesia Type: General  Level of Consciousness: awake, alert  and oriented  Airway and Oxygen Therapy: Patient Spontanous Breathing  Post-op Pain: none  Post-op Assessment: Post-op Vital signs reviewed and Patient's Cardiovascular Status Stable  Post-op Vital Signs: Reviewed and stable  Complications: No apparent anesthesia complications

## 2011-12-04 NOTE — Addendum Note (Signed)
Addendum  created 12/04/11 1712 by Shanon Payor, CRNA   Modules edited:Notes Section

## 2011-12-04 NOTE — Anesthesia Preprocedure Evaluation (Signed)
Anesthesia Evaluation  Patient identified by MRN, date of birth, ID band Patient awake    Reviewed: Allergy & Precautions, H&P , Patient's Chart, lab work & pertinent test results, reviewed documented beta blocker date and time   Airway Mallampati: II TM Distance: >3 FB Neck ROM: full    Dental No notable dental hx.    Pulmonary  breath sounds clear to auscultation  Pulmonary exam normal       Cardiovascular hypertension, Pt. on medications Rhythm:regular Rate:Normal     Neuro/Psych    GI/Hepatic   Endo/Other    Renal/GU      Musculoskeletal   Abdominal   Peds  Hematology   Anesthesia Other Findings   Reproductive/Obstetrics                           Anesthesia Physical Anesthesia Plan  ASA: II  Anesthesia Plan: General   Post-op Pain Management:    Induction: Intravenous  Airway Management Planned: Oral ETT  Additional Equipment:   Intra-op Plan:   Post-operative Plan:   Informed Consent: I have reviewed the patients History and Physical, chart, labs and discussed the procedure including the risks, benefits and alternatives for the proposed anesthesia with the patient or authorized representative who has indicated his/her understanding and acceptance.   Dental Advisory Given and Dental advisory given  Plan Discussed with: CRNA and Surgeon  Anesthesia Plan Comments: (  Discussed  general anesthesia, including possible nausea, instrumentation of airway, sore throat,pulmonary aspiration, etc. I asked if the were any outstanding questions, or  concerns before we proceeded. )        Anesthesia Quick Evaluation  

## 2011-12-04 NOTE — Progress Notes (Signed)
Day of Surgery Procedure(s) (LRB): HYSTERECTOMY ABDOMINAL (N/A) CYSTOSCOPY (N/A)  Subjective: Patient reports nausea and incisional pain.    Objective: Filed Vitals:   12/04/11 2123  BP:   Pulse:   Temp:   Resp: 12   I have reviewed patient's vital signs and intake and output.  General: alert but groggy Resp: clear to auscultation bilaterally Cardio: regular rate and rhythm GI: soft, appropriately tender, ND, dressing c/d/i, minimal BS Extremities: no edema, redness or tenderness in the calves or thighs Vaginal Bleeding: none and vaginal packing in place  Assessment: s/p Procedure(s) (LRB): HYSTERECTOMY ABDOMINAL (N/A) CYSTOSCOPY (N/A): stable  Plan: Routine postop care.  CBC in AM.  Reglan for nausea now and/or phenergan prn.  Zofran not adequately controlling symptoms.  SCDs for DVT prophylaxis.  Encourage IS.  LOS: 0 days    Wanda Herman Y 12/04/2011, 9:35 PM

## 2011-12-04 NOTE — Transfer of Care (Signed)
Immediate Anesthesia Transfer of Care Note  Patient: Wanda Herman  Procedure(s) Performed: Procedure(s) (LRB): HYSTERECTOMY ABDOMINAL (N/A) CYSTOSCOPY (N/A)  Patient Location: PACU  Anesthesia Type: General  Level of Consciousness: awake, alert  and oriented  Airway & Oxygen Therapy: Patient Spontanous Breathing and Patient connected to nasal cannula oxygen  Post-op Assessment: Report given to PACU RN and Post -op Vital signs reviewed and stable  Post vital signs: Reviewed and stable  Complications: No apparent anesthesia complications

## 2011-12-04 NOTE — Interval H&P Note (Signed)
History and Physical Interval Note:  12/04/2011 8:25 AM  Wanda Herman  has presented today for surgery, with the diagnosis of Symtomatic Fibriods  The various methods of treatment have been discussed with the patient and family. After consideration of risks, benefits and other options for treatment, the patient has consented to  Procedure(s) (LRB): HYSTERECTOMY TOTAL LAPAROSCOPIC (N/A) LAPAROSCOPIC ASSISTED VAGINAL HYSTERECTOMY (N/A) HYSTERECTOMY ABDOMINAL (N/A) as a surgical intervention .  The patients' history has been reviewed, patient examined, no change in status, stable for surgery.  I have reviewed the patients' chart and labs.  Questions were answered to the patient's satisfaction.     Purcell Nails

## 2011-12-04 NOTE — Preoperative (Signed)
Beta Blockers   Reason not to administer Beta Blockers:Not Applicable 

## 2011-12-05 ENCOUNTER — Encounter (HOSPITAL_COMMUNITY): Payer: Self-pay | Admitting: Obstetrics and Gynecology

## 2011-12-05 LAB — CBC
MCH: 30.3 pg (ref 26.0–34.0)
MCHC: 33.5 g/dL (ref 30.0–36.0)
Platelets: 265 10*3/uL (ref 150–400)

## 2011-12-05 MED ORDER — OXYCODONE-ACETAMINOPHEN 5-325 MG PO TABS
1.0000 | ORAL_TABLET | ORAL | Status: DC | PRN
  Administered 2011-12-05: 1 via ORAL
  Administered 2011-12-05 (×2): 2 via ORAL
  Administered 2011-12-05: 1 via ORAL
  Administered 2011-12-06: 2 via ORAL
  Filled 2011-12-05: qty 1
  Filled 2011-12-05: qty 2
  Filled 2011-12-05: qty 1
  Filled 2011-12-05 (×2): qty 2

## 2011-12-05 MED ORDER — LOSARTAN POTASSIUM 25 MG PO TABS
25.0000 mg | ORAL_TABLET | Freq: Once | ORAL | Status: DC
  Filled 2011-12-05: qty 1

## 2011-12-05 MED ORDER — IBUPROFEN 600 MG PO TABS
600.0000 mg | ORAL_TABLET | Freq: Four times a day (QID) | ORAL | Status: DC
  Administered 2011-12-05 – 2011-12-06 (×4): 600 mg via ORAL
  Filled 2011-12-05 (×4): qty 1

## 2011-12-05 NOTE — Progress Notes (Signed)
UR Chart review completed.  

## 2011-12-05 NOTE — Progress Notes (Signed)
1 Day Post-Op Procedure(s) (LRB): HYSTERECTOMY ABDOMINAL (N/A) CYSTOSCOPY (N/A)  Subjective: Patient reports that pain is well managed, has no more nausea, tolerating liquids, ambulating in the halls and feeling "much" better than yesterday. Hasn't voided since Foley was removed nor has she passed flatus.   Objective: BP 116/77  Pulse 85  Temp(Src) 98.2 F (36.8 C) (Oral)  Resp 16  Ht 5\' 3"  (1.6 m)  Wt 160 lb (72.576 kg)  BMI 28.34 kg/m2  SpO2 100% Lungs: clear Heart: normal rate and rhythm Abdomen:soft and appropriately tender, dressing clean, dry and intact. Extremities: Homans sign is negative, no sign of DVT, SCD hose in place & functioning. H/H = 10.4/31.0  Assessment: s/p Procedure(s): HYSTERECTOMY ABDOMINAL CYSTOSCOPY: progressing well and anemia  Plan: Advance diet Encourage ambulation Advance to PO medication if breakfast is tolerated   LOS: 1 day    Ahmaad Neidhardt, PA-C 12/05/2011, 7:18 AM

## 2011-12-06 DIAGNOSIS — Z90710 Acquired absence of both cervix and uterus: Secondary | ICD-10-CM | POA: Diagnosis not present

## 2011-12-06 MED ORDER — IBUPROFEN 600 MG PO TABS: 600.0000 mg | ORAL_TABLET | Freq: Four times a day (QID) | ORAL | Status: AC

## 2011-12-06 MED ORDER — OXYCODONE-ACETAMINOPHEN 5-325 MG PO TABS: 1.0000 | ORAL_TABLET | ORAL | Status: AC | PRN

## 2011-12-06 NOTE — Progress Notes (Signed)
2 Days Post-Op Procedure(s) (LRB): HYSTERECTOMY ABDOMINAL (N/A) CYSTOSCOPY (N/A)  Subjective: Patient reports that pain is well managed with oral analgesia. Tolerating regular  diet without difficulty. No nausea / vomiting.   Ambulating and voiding.  Objective: BP 108/72  Pulse 83  Temp(Src) 98 F (36.7 C) (Oral)  Resp 18  Ht 5\' 3"  (1.6 m)  Wt 160 lb (72.576 kg)  BMI 28.34 kg/m2  SpO2 98% Lungs: clear Heart: normal rate and rhythm Abdomen:soft,  BS ++ and appropriately tender; incision with good wound edge approximation, steri strips in place with no evidence of infection. Extremities: Homans sign is negative, no sign of DVT   Assessment: s/p Procedure(s): HYSTERECTOMY ABDOMINAL CYSTOSCOPY: stable and anemia  Plan: Discharge home F/U with Dr. Su Hilt in 6 weeks  LOS: 2 days    Wanda Herman 12/06/2011, 7:42 AM

## 2011-12-06 NOTE — Discharge Summary (Signed)
Physician Discharge Summary  Patient ID: YATZIRI WAINWRIGHT MRN: [id number] DOB/AGE: 1970-11-06 41 y.o.  Admit date: 12/04/2011 Discharge date: 12/06/2011  Admission Diagnoses: Symtomatic Fibriods  Discharge Diagnoses: Symtomatic Fibriods        Active Problems:  S/P hysterectomy   Discharged Condition: Stable  Hospital Course: On the date of admission the patient underwent a total abdominal hysterectomy, tolerating procedure well.  She had a multiple fibroid uterus removed along with cervix with an aggregate weight of 828.6 grams.  Post operative course was unremarkable with patient resuming bowel and bladder function by post operative day #2 and was therefore deemed ready for discharge home.    Disposition: Discharged home to self care  Discharge Orders    Future Appointments: Provider: Department: Dept Phone: Center:   01/15/2012 2:00 PM David Stall, MD [facility] [phone] [facility]     Medication List  As of 12/06/2011  7:37 AM   STOP taking these medications         ORTHO TRI-CYCLEN LO 0.18/0.215/0.25 MG-25 MCG tablet         TAKE these medications         cetirizine 10 MG tablet   Commonly known as: ZYRTEC   Take 10 mg by mouth daily as needed. For allergies      ibuprofen 600 MG tablet   Commonly known as: ADVIL,MOTRIN   Take 1 tablet (600 mg total) by mouth every 6 (six) hours.      losartan 25 MG tablet   Commonly known as: COZAAR   TAKE 1 TABLET BY MOUTH EVERY DAY      mometasone 50 MCG/ACT nasal spray   Commonly known as: NASONEX   Place 2 sprays into the nose daily. Take once in a while      oxyCODONE-acetaminophen 5-325 MG per tablet   Commonly known as: PERCOCET   Take 1-2 tablets by mouth every 4 (four) hours as needed (if tolerating p.o.).      oxymetazoline 0.05 % nasal spray   Commonly known as: AFRIN   Place 2 sprays into the nose daily as needed. For nasal congestion      SYSTANE BALANCE OP   Place 1 drop into both eyes at  bedtime.           Follow-up Information    Follow up with Purcell Nails, MD. Call in 6 weeks.   Contact information:   [address] [place] [address] [phone]          Signed: Patrick Jupiter 12/06/2011, 7:37 AM

## 2011-12-06 NOTE — Discharge Instructions (Signed)
Call [place], [phone] for:  Post operative appointment time and date Temperature greater than or equal to 100.4 degrees Fahrenheit orally Excessive pain not managed with your pain medications Excessive bleeding, problems urinating or other concerns   While taking pain medications, take Colace (Docusate Sodium) 100 mg 2-3 times daily until bowel movements are regular to prevent constipation.  You may shower post-operative day #1 You may walk up stairs You may drive after  2  weeks You may lift items greater than 20 pounds after 6 weeks You should not resume sexual activity until after  6 weeks    You may resume a regular diet

## 2011-12-12 ENCOUNTER — Other Ambulatory Visit: Payer: Self-pay | Admitting: Internal Medicine

## 2011-12-12 NOTE — Telephone Encounter (Signed)
Refill done.  

## 2011-12-18 ENCOUNTER — Telehealth: Payer: Self-pay | Admitting: Obstetrics and Gynecology

## 2011-12-18 NOTE — Telephone Encounter (Signed)
Routed to jackie  

## 2011-12-18 NOTE — Telephone Encounter (Signed)
Spoke to pt who states she is having a small amt of swelling at her incision site S/P surgery 2 weeks ago. No real redness, no fever, and no pusy d/c. I told her it all sounds like nl surgical site healing, but if any of the above sx's occur to call and let us know. Wanda Herman A

## 2012-01-15 ENCOUNTER — Encounter: Payer: Self-pay | Admitting: Obstetrics and Gynecology

## 2012-01-15 ENCOUNTER — Ambulatory Visit (INDEPENDENT_AMBULATORY_CARE_PROVIDER_SITE_OTHER): Payer: Managed Care, Other (non HMO) | Admitting: Obstetrics and Gynecology

## 2012-01-15 ENCOUNTER — Ambulatory Visit: Payer: Managed Care, Other (non HMO) | Admitting: "Endocrinology

## 2012-01-15 VITALS — BP 102/74 | Temp 98.3°F | Ht 63.0 in | Wt 153.0 lb

## 2012-01-15 DIAGNOSIS — Z09 Encounter for follow-up examination after completed treatment for conditions other than malignant neoplasm: Secondary | ICD-10-CM

## 2012-01-15 NOTE — Progress Notes (Signed)
DATE OF SURGERY: 12/04/2011 TYPE OF SURGERY:Part. Abd hyst PAIN:No VAG BLEEDING: no VAG DISCHARGE: no NORMAL GI FUNCTN: yes NORMAL GU FUNCTN: yes  No complaints Path benign fibroids Filed Vitals:   01/15/12 0854  BP: 102/74  Temp: 98.3 F (36.8 C)   ROS: noncontributory  Pelvic exam:  VULVA: normal appearing vulva with no masses, tenderness or lesions,  VAGINA: normal appearing vagina with normal color and discharge, no lesions, cuff well healed  UTERUS: s/p hyst ADNEXA: normal adnexa in size, nontender and no masses. ABD incision well healed

## 2012-02-27 ENCOUNTER — Ambulatory Visit: Payer: Managed Care, Other (non HMO) | Admitting: "Endocrinology

## 2012-05-10 ENCOUNTER — Other Ambulatory Visit: Payer: Self-pay | Admitting: Internal Medicine

## 2012-05-11 NOTE — Telephone Encounter (Signed)
Will need ov prior to more refills.  

## 2012-06-30 ENCOUNTER — Other Ambulatory Visit: Payer: Self-pay | Admitting: "Endocrinology

## 2012-07-01 LAB — COMPREHENSIVE METABOLIC PANEL
Albumin: 4.9 g/dL (ref 3.5–5.2)
Alkaline Phosphatase: 70 U/L (ref 39–117)
BUN: 17 mg/dL (ref 6–23)
Calcium: 9.6 mg/dL (ref 8.4–10.5)
Chloride: 101 mEq/L (ref 96–112)
Glucose, Bld: 96 mg/dL (ref 70–99)
Potassium: 4.1 mEq/L (ref 3.5–5.3)

## 2012-07-01 LAB — T4, FREE: Free T4: 1.01 ng/dL (ref 0.80–1.80)

## 2012-07-01 LAB — T3, FREE: T3, Free: 2.7 pg/mL (ref 2.3–4.2)

## 2012-07-06 ENCOUNTER — Ambulatory Visit (INDEPENDENT_AMBULATORY_CARE_PROVIDER_SITE_OTHER): Payer: Managed Care, Other (non HMO) | Admitting: Family Medicine

## 2012-07-06 VITALS — BP 138/99 | Ht 63.0 in | Wt 155.0 lb

## 2012-07-06 DIAGNOSIS — M25519 Pain in unspecified shoulder: Secondary | ICD-10-CM

## 2012-07-06 DIAGNOSIS — M25511 Pain in right shoulder: Secondary | ICD-10-CM

## 2012-07-06 MED ORDER — MELOXICAM 15 MG PO TABS: 15.0000 mg | ORAL_TABLET | Freq: Every day | ORAL | Status: DC

## 2012-07-06 NOTE — Patient Instructions (Addendum)
You have rotator cuff impingement Try to avoid painful activities (overhead activities, lifting with extended arm) as much as possible. Mobic 15mg  daily with food x 7 days for pain and inflammation then as needed. Subacromial injection may be beneficial to help with pain and to decrease inflammation. Start physical therapy with transition to home exercise program. Do home exercise program with theraband and scapular stabilization exercises daily - these are very important for long term relief even if an injection was given. If not improving at follow-up we will consider injection, nitro patches, MRI depending on how you're doing and your exam. Follow up with me in 6 weeks.

## 2012-07-07 ENCOUNTER — Encounter: Payer: Self-pay | Admitting: "Endocrinology

## 2012-07-07 ENCOUNTER — Ambulatory Visit (INDEPENDENT_AMBULATORY_CARE_PROVIDER_SITE_OTHER): Payer: Managed Care, Other (non HMO) | Admitting: "Endocrinology

## 2012-07-07 ENCOUNTER — Encounter: Payer: Self-pay | Admitting: Family Medicine

## 2012-07-07 VITALS — BP 122/73 | HR 77 | Wt 160.2 lb

## 2012-07-07 DIAGNOSIS — E049 Nontoxic goiter, unspecified: Secondary | ICD-10-CM

## 2012-07-07 DIAGNOSIS — I10 Essential (primary) hypertension: Secondary | ICD-10-CM

## 2012-07-07 DIAGNOSIS — E063 Autoimmune thyroiditis: Secondary | ICD-10-CM

## 2012-07-07 DIAGNOSIS — R946 Abnormal results of thyroid function studies: Secondary | ICD-10-CM

## 2012-07-07 NOTE — Patient Instructions (Signed)
Follow up visit on one year. Please have thyroid blood tests repeated in 6 and 12 months.

## 2012-07-07 NOTE — Progress Notes (Signed)
CC: FU thyroiditis, goiter, transient hyperthyroidism due to flare of thyroiditis, transient hypothyroidism due to flare of thyroiditis, hypertension  HPI: [name] is a 41 y.o. African-American woman. She was unaccompanied.  1. I have been following the patient since 01/04/05 for the above issues. In the subsequent 7 years I've seen [name] in clinic about once every 6 months. She has had multiple flare-ups of Hashimoto's Thyroiditis over the years that have resulted in her being hyperthyroid (Hashitoxicosis), hypothyroid, and euthyroid in between. Her TSH values have varied from 0.098-4.122. Free T4 values have varied from 0.60-1.23. Free T3 values have varied from 2.2-3.3. TPO antibody levels have always been elevated between  52.2-113.3. Fortunately, she has been euthyroid most of the time, so I have never treated her with Synthroid.  2. Her last [facility] visit was on 07/16/11. In the interim she has remained healthy, except for some seasonal allergy symptoms. She had a hysterectomy for severe fibroids in April. She no longer takes Zyrtec and Nasonex. She does use Restasis for her dry eyes and losartan for BP control. 3.Pertinent Review of Systems: Constitutional: The patient feels "fine, a little tired". She has been "pretty healthy".  Eyes: Her vision has been good since her lasik surgery three years ago. There are no significant eye complaints. Neck: The patient has no complaints of anterior neck swelling, soreness, tenderness,  pressure, discomfort, or difficulty swallowing.  Heart: Heart rate increases with exercise or other physical activity. The patient has no complaints of palpitations, irregular heat beats, chest pain, or chest pressure. Gastrointestinal: Bowel movents seem normal. She has no GI complaints. Legs: Muscle mass and strength seem normal. There are no complaints of numbness, tingling, burning, or pain. No edema is noted. Feet: There are no obvious foot problems. There are  no complaints of numbness, tingling, burning, or pain. No edema is noted. GYN: S/p hysterectomy as above.  PAST MEDICAL, FAMILY, AND SOCIAL HISTORY: 1. Work and family: Her marriage and job are going well. 2. Activities: She does not exercise very often anymore.   3. Tobacco, alcohol, and illicit drugs: Never 4. PCP: Dr. Drue Novel  ROS: [name] has no other significant complaints involving any of her other body systems.  PHYSICAL EXAM: BP 122/73  Pulse 77  Wt 160 lb 3.2 oz (72.666 kg)  LMP 11/22/2011 She has gained 5 pounds since last visit.  Constitutional: The patient looks healthy and appears physically and emotionally well.  Eyes: There is no arcus or proptosis.  Mouth: The oropharynx appears normal. The tongue appears normal. There is normal oral moisture. There is no obvious gingivitis. Neck: There are no bruits present. The thyroid gland appears somewhat enlarged in size. The thyroid gland is approximately 20+ grams in size. The right lobe is within normal for size and consistency. The left lobe is slightly enlarged. The midportion of the left lobe is normal in consistency today. There is no thyroid tenderness to palpation. Lungs: The lungs are clear. Air movement is good. Heart: The heart rhythm and rate appear normal. Heart sounds S1 and S2 are normal. I do not appreciate any pathologic heart murmurs. Abdomen: The abdominal size is normal. Bowel sounds are normal. The abdomen is soft and non-tender. There is no obviously palpable hepatomegaly, splenomegaly, or other masses.  Arms: Muscle mass appears appropriate for age.  Hands: There is no obvious tremor. Phalangeal and metacarpophalangeal joints appear normal. Palms are normal. Legs: Muscle mass appears appropriate for age. There is no edema.   Neurologic:  Muscle strength is normal for age and gender  in both the upper and the lower extremities. Muscle tone appears normal. Sensation to touch is normal in the legs.    Labs: 06/30/12: Normal TFTs (TSH 1.508, free T4 1.01, free T3 2.7), normal CMP 07/06/11: Normal TFTs (TSH 2.674, free T4 1.23, free T3 2.4)  ASSESSMENT: 1. Goiter: The thyroid gland is about the same size, but with a more normal consistency indicating less thyroiditis today.  2. Abnormal TFTs/transient Hypothyroidism: She is mid-range euthyroid today. She does not need Synthroid treatment at this time. 3. Thyroiditis: Her Hashimoto's disease is clinically quiescent. 4. Hypertension: Her BP is lower today on her current dose of losartan.    PLAN: 1. Diagnostic: TFTs in 6 months and 12 months. 2. Therapeutic: No Synthroid medication at this time.   3. Patient education: We discussed how Hashimoto's Dz might make her hypothyroid at some time in the future. 4. Follow-Up: FU appointment in 12 months with me. FU with Dr. Drue Novel re BP.   Level of Service: This visit lasted in excess of 40 minutes. More than 50% of the visit was devoted to counseling.  David Stall

## 2012-07-07 NOTE — Assessment & Plan Note (Signed)
2/2 rotator cuff impingement.  Start mobic daily with food.  Start formal physical therapy and home exercise program.  Declined subacromial injection today.  F/u in 6 weeks.  If not improving consider injection, nitro patches, and/or further imaging.

## 2012-07-07 NOTE — Progress Notes (Signed)
Subjective:    Patient ID: Wanda Herman, female    DOB: Dec 08, 1970, 41 y.o.   MRN: [id number]  PCP: Dr. Drue Novel  HPI 41 yo F here for right shoulder pain.  Patient denies known injury. States for several months (though worse over past month) she has had right shoulder pain. Bothers with picking things up, reaching. Feels anterior and lateral shoulder but also some posterior. Was better with medications she took after her hysterectomy but worsened again after finishing these. Has done a little home stretching but no formal home program or physical therapy. No numbness or tingling. No neck pain. No left shoulder pain. Is right handed. Works as an Airline pilot.  Past Medical History  Diagnosis Date  . Thyroiditis, autoimmune   . Goiter   . Hypertension   . Hypothyroidism, acquired, autoimmune     no meds  . Hypothyroidism, acquired, autoimmune   . Gestational diabetes 2005  . Headache   . H/O candidiasis   . H/O varicella   . Breast mass, right 05/30/04  . Breast mass, left 07/08/06  . Fibroid 08/21/2009    Current Outpatient Prescriptions on File Prior to Visit  Medication Sig Dispense Refill  . losartan (COZAAR) 25 MG tablet TAKE 1 TABLET BY MOUTH EVERY DAY  30 tablet  0  . cetirizine (ZYRTEC) 10 MG tablet Take 10 mg by mouth daily as needed. For allergies      . mometasone (NASONEX) 50 MCG/ACT nasal spray Place 2 sprays into the nose daily. Take once in a while      . Norgestim-Eth Estrad Triphasic (TRI-SPRINTEC PO) Take by mouth.      Marland Kitchen oxymetazoline (AFRIN) 0.05 % nasal spray Place 2 sprays into the nose daily as needed. For nasal congestion      . Propylene Glycol (SYSTANE BALANCE OP) Place 1 drop into both eyes at bedtime.        Past Surgical History  Procedure Date  . Lasik   . Eye surgery   . Abdominal hysterectomy 12/04/2011    Procedure: HYSTERECTOMY ABDOMINAL;  Surgeon: Purcell Nails, MD;  Location: [facility];  Service: Gynecology;  Laterality: N/A;  .  Cystoscopy 12/04/2011    Procedure: CYSTOSCOPY;  Surgeon: Purcell Nails, MD;  Location: [facility];  Service: Gynecology;  Laterality: N/A;    No Known Allergies  History   Social History  . Marital Status: Married    Spouse Name: N/A    Number of Children: N/A  . Years of Education: N/A   Occupational History  . Not on file.   Social History Main Topics  . Smoking status: Never Smoker   . Smokeless tobacco: Not on file  . Alcohol Use: No  . Drug Use: No  . Sexually Active: Yes    Birth Control/ Protection: Pill     Comment: tri-sprinted   Other Topics Concern  . Not on file   Social History Narrative   Diet- low salt, not as well as she likes, thinking about WWExercise when she can, at least 1 or 2 times a week    Family History  Problem Relation Age of Onset  . Diabetes Maternal Aunt   . Colon cancer Neg Hx   . Breast cancer Neg Hx   . Heart attack      GF at 41 y/o  . Diabetes Maternal Grandmother   . Hypertension Maternal Grandmother   . Cancer Maternal Aunt     ovarian cancer  . Diabetes  Mother   . Hypertension Mother   . Hyperlipidemia Mother     BP 138/99  Ht 5\' 3"  (1.6 m)  Wt 155 lb (70.308 kg)  BMI 27.46 kg/m2  LMP 11/22/2011  Review of Systems See HPI above.    Objective:   Physical Exam Gen: NAD  R shoulder: No swelling, ecchymoses.  No gross deformity. No TTP biceps tendon, AC joint, elsewhere about shoulder. FROM with painful arc. Positive Hawkins, Neers. Negative Speeds, Yergasons. Strength 5/5 with empty can and resisted internal/external rotation.  Pain mild with empty can. Negative apprehension. NV intact distally.  L shoulder: FROM without pain or weakness.     Assessment & Plan:  1. Right shoulder pain - 2/2 rotator cuff impingement.  Start mobic daily with food.  Start formal physical therapy and home exercise program.  Declined subacromial injection today.  F/u in 6 weeks.  If not improving consider injection, nitro  patches, and/or further imaging.

## 2012-07-08 ENCOUNTER — Ambulatory Visit: Payer: Managed Care, Other (non HMO) | Attending: Family Medicine | Admitting: Physical Therapy

## 2012-07-08 DIAGNOSIS — M25519 Pain in unspecified shoulder: Secondary | ICD-10-CM | POA: Insufficient documentation

## 2012-07-08 DIAGNOSIS — M25619 Stiffness of unspecified shoulder, not elsewhere classified: Secondary | ICD-10-CM | POA: Insufficient documentation

## 2012-07-08 DIAGNOSIS — IMO0001 Reserved for inherently not codable concepts without codable children: Secondary | ICD-10-CM | POA: Insufficient documentation

## 2012-07-12 ENCOUNTER — Other Ambulatory Visit: Payer: Self-pay | Admitting: Internal Medicine

## 2012-07-13 ENCOUNTER — Ambulatory Visit: Payer: Managed Care, Other (non HMO) | Admitting: Physical Therapy

## 2012-07-13 NOTE — Telephone Encounter (Signed)
Pt has not been seen within a year. OK to refill? 

## 2012-07-13 NOTE — Telephone Encounter (Signed)
Discussed with pt. She states she is going to call back to schedule appt.

## 2012-07-13 NOTE — Telephone Encounter (Signed)
Advise patient, I just refill 15, needs to be seen within 15 days. No further refills without visit

## 2012-07-15 ENCOUNTER — Ambulatory Visit: Payer: Managed Care, Other (non HMO) | Admitting: Physical Therapy

## 2012-07-17 ENCOUNTER — Ambulatory Visit (INDEPENDENT_AMBULATORY_CARE_PROVIDER_SITE_OTHER): Payer: Managed Care, Other (non HMO) | Admitting: Internal Medicine

## 2012-07-17 ENCOUNTER — Encounter: Payer: Self-pay | Admitting: Internal Medicine

## 2012-07-17 VITALS — BP 112/80 | HR 78 | Temp 98.3°F | Wt 162.0 lb

## 2012-07-17 DIAGNOSIS — I10 Essential (primary) hypertension: Secondary | ICD-10-CM

## 2012-07-17 LAB — CBC WITH DIFFERENTIAL/PLATELET
Basophils Relative: 1 % (ref 0.0–3.0)
Eosinophils Absolute: 0.2 10*3/uL (ref 0.0–0.7)
Eosinophils Relative: 3.4 % (ref 0.0–5.0)
HCT: 38.9 % (ref 36.0–46.0)
Hemoglobin: 12.8 g/dL (ref 12.0–15.0)
Lymphs Abs: 2.2 10*3/uL (ref 0.7–4.0)
MCHC: 33 g/dL (ref 30.0–36.0)
MCV: 92.2 fl (ref 78.0–100.0)
Monocytes Absolute: 0.4 10*3/uL (ref 0.1–1.0)
Neutro Abs: 2.4 10*3/uL (ref 1.4–7.7)
RBC: 4.22 Mil/uL (ref 3.87–5.11)
WBC: 5.2 10*3/uL (ref 4.5–10.5)

## 2012-07-17 LAB — LIPID PANEL
Total CHOL/HDL Ratio: 3
Triglycerides: 59 mg/dL (ref 0.0–149.0)

## 2012-07-17 LAB — LDL CHOLESTEROL, DIRECT: Direct LDL: 133.6 mg/dL

## 2012-07-17 MED ORDER — LOSARTAN POTASSIUM 25 MG PO TABS: 25.0000 mg | ORAL_TABLET | Freq: Every day | ORAL | Status: DC

## 2012-07-17 NOTE — Assessment & Plan Note (Signed)
Good compliance with medications, not ambulatory BPs but per chart review BPs are within normal. Labs are reviewed, she has some anemia, last cholesterol panel in about a year ago Plan: Refill medicines, CBC, FLP.

## 2012-07-17 NOTE — Patient Instructions (Addendum)
Check the  blood pressure 2 or 3 times a month, be sure it is between 110/60 and 140/80. If it is consistently higher or lower, let me know

## 2012-07-17 NOTE — Progress Notes (Signed)
  Subjective:    Patient ID: Wanda Herman, female    DOB: 09-02-1970, 41 y.o.   MRN: [id number]  HPI Here at my request for hypertension management Doing well, good medication compliance with BP meds, not ambulatory BPs but chart is reviewed ---> BPs usually normal at the doctor's office.  Past Medical History  Diagnosis Date  . Thyroiditis, autoimmune   . Goiter   . Hypertension   . Hypothyroidism, acquired, autoimmune     no meds  . Hypothyroidism, acquired, autoimmune   . Gestational diabetes 2005  . Headache   . H/O candidiasis   . H/O varicella   . Breast mass, right 05/30/04  . Breast mass, left 07/08/06  . Fibroid 08/21/2009   Past Surgical History  Procedure Date  . Lasik   . Abdominal hysterectomy 12/04/2011    Procedure: HYSTERECTOMY ABDOMINAL;  Surgeon: Purcell Nails, MD;  Location: [facility];  Service: Gynecology;  Laterality: N/A;  . Cystoscopy 12/04/2011    Procedure: CYSTOSCOPY;  Surgeon: Purcell Nails, MD;  Location: [facility];  Service: Gynecology;  Laterality: N/A;   History   Social History  . Marital Status: Married    Spouse Name: N/A    Number of Children: 2  . Years of Education: N/A   Occupational History  . accountant    Social History Main Topics  . Smoking status: Never Smoker   . Smokeless tobacco: Never Used  . Alcohol Use: No  . Drug Use: No  . Sexually Active: Yes    Birth Control/ Protection: Pill     Comment: tri-sprinted   Other Topics Concern  . Not on file   Social History Narrative   Diet- average ---Exercise: not much lately     Review of Systems No chest pain or shortness of breath No nausea, vomiting, diarrhea.     Objective:   Physical Exam General -- alert, well-developed, and well-nourished.    Lungs -- normal respiratory effort, no intercostal retractions, no accessory muscle use, and normal breath sounds.   Heart-- normal rate, regular rhythm, no murmur, and no gallop.   Extremities-- no pretibial edema  bilaterally  Psych-- Cognition and judgment appear intact. Alert and cooperative with normal attention span and concentration.  not anxious appearing and not depressed appearing.       Assessment & Plan:

## 2012-07-19 ENCOUNTER — Encounter: Payer: Self-pay | Admitting: Internal Medicine

## 2012-07-20 ENCOUNTER — Ambulatory Visit: Payer: Managed Care, Other (non HMO) | Attending: Family Medicine | Admitting: Rehabilitation

## 2012-07-20 DIAGNOSIS — M25519 Pain in unspecified shoulder: Secondary | ICD-10-CM | POA: Insufficient documentation

## 2012-07-20 DIAGNOSIS — IMO0001 Reserved for inherently not codable concepts without codable children: Secondary | ICD-10-CM | POA: Insufficient documentation

## 2012-07-20 DIAGNOSIS — M25619 Stiffness of unspecified shoulder, not elsewhere classified: Secondary | ICD-10-CM | POA: Insufficient documentation

## 2012-07-22 ENCOUNTER — Encounter: Payer: Self-pay | Admitting: *Deleted

## 2012-07-22 ENCOUNTER — Ambulatory Visit: Payer: Managed Care, Other (non HMO) | Admitting: Rehabilitation

## 2012-07-28 ENCOUNTER — Ambulatory Visit: Payer: Managed Care, Other (non HMO) | Admitting: Physical Therapy

## 2012-07-30 ENCOUNTER — Ambulatory Visit: Payer: Managed Care, Other (non HMO) | Admitting: Rehabilitation

## 2012-08-06 ENCOUNTER — Ambulatory Visit: Payer: Managed Care, Other (non HMO) | Admitting: Physical Therapy

## 2012-08-07 ENCOUNTER — Ambulatory Visit: Payer: Managed Care, Other (non HMO) | Admitting: Physical Therapy

## 2012-08-10 ENCOUNTER — Ambulatory Visit: Payer: Managed Care, Other (non HMO) | Admitting: Rehabilitation

## 2012-08-13 ENCOUNTER — Ambulatory Visit: Payer: Managed Care, Other (non HMO) | Admitting: Physical Therapy

## 2012-08-17 ENCOUNTER — Ambulatory Visit: Payer: Managed Care, Other (non HMO) | Admitting: Physical Therapy

## 2012-08-17 ENCOUNTER — Ambulatory Visit (INDEPENDENT_AMBULATORY_CARE_PROVIDER_SITE_OTHER): Payer: Managed Care, Other (non HMO) | Admitting: Family Medicine

## 2012-08-17 ENCOUNTER — Encounter: Payer: Self-pay | Admitting: Family Medicine

## 2012-08-17 VITALS — BP 118/84 | HR 78 | Ht 63.0 in | Wt 160.0 lb

## 2012-08-17 DIAGNOSIS — M25519 Pain in unspecified shoulder: Secondary | ICD-10-CM

## 2012-08-17 DIAGNOSIS — M25511 Pain in right shoulder: Secondary | ICD-10-CM

## 2012-08-17 NOTE — Progress Notes (Signed)
Subjective:    Patient ID: Wanda Herman, female    DOB: 1971/07/14, 41 y.o.   MRN: [id number]  PCP: Dr. Drue Novel  HPI  41 yo F here for f/u right shoulder pain.  11/18: Patient denies known injury. States for several months (though worse over past month) she has had right shoulder pain. Bothers with picking things up, reaching. Feels anterior and lateral shoulder but also some posterior. Was better with medications she took after her hysterectomy but worsened again after finishing these. Has done a little home stretching but no formal home program or physical therapy. No numbness or tingling. No neck pain. No left shoulder pain. Is right handed. Works as an Airline pilot.  12/30: Patient reports she's much improved since last visit with PT and HEP About 90% improved. Occasional overhead motions will cause a catch or pain but not consistently. Not taking anything for pain. Last PT visit is today. No night pain.  Past Medical History  Diagnosis Date  . Thyroiditis, autoimmune   . Goiter   . Hypertension   . Hypothyroidism, acquired, autoimmune     no meds  . Hypothyroidism, acquired, autoimmune   . Gestational diabetes 2005  . Headache   . H/O candidiasis   . H/O varicella   . Breast mass, right 05/30/04  . Breast mass, left 07/08/06  . Fibroid 08/21/2009    Current Outpatient Prescriptions on File Prior to Visit  Medication Sig Dispense Refill  . cycloSPORINE (RESTASIS) 0.05 % ophthalmic emulsion 1 drop 2 (two) times daily.      Marland Kitchen losartan (COZAAR) 25 MG tablet Take 1 tablet (25 mg total) by mouth daily.  90 tablet  3  . meloxicam (MOBIC) 15 MG tablet Take 1 tablet (15 mg total) by mouth daily. With food.  30 tablet  1  . Propylene Glycol (SYSTANE BALANCE OP) Place 1 drop into both eyes at bedtime.        Past Surgical History  Procedure Date  . Lasik   . Abdominal hysterectomy 12/04/2011    Procedure: HYSTERECTOMY ABDOMINAL;  Surgeon: Purcell Nails, MD;   Location: [facility];  Service: Gynecology;  Laterality: N/A;  . Cystoscopy 12/04/2011    Procedure: CYSTOSCOPY;  Surgeon: Purcell Nails, MD;  Location: [facility];  Service: Gynecology;  Laterality: N/A;    No Known Allergies  History   Social History  . Marital Status: Married    Spouse Name: N/A    Number of Children: 2  . Years of Education: N/A   Occupational History  . accountant    Social History Main Topics  . Smoking status: Never Smoker   . Smokeless tobacco: Never Used  . Alcohol Use: No  . Drug Use: No  . Sexually Active: Yes    Birth Control/ Protection: Pill     Comment: tri-sprinted   Other Topics Concern  . Not on file   Social History Narrative   Diet- average ---Exercise: not much lately     Family History  Problem Relation Age of Onset  . Diabetes Maternal Aunt   . Colon cancer Neg Hx   . Breast cancer Neg Hx   . Heart attack      GF at 41 y/o  . Diabetes Maternal Grandmother   . Hypertension Maternal Grandmother   . Cancer Maternal Aunt     ovarian cancer  . Diabetes Mother   . Hypertension Mother   . Hyperlipidemia Mother     BP 118/84  Pulse 78  Ht 5\' 3"  (1.6 m)  Wt 160 lb (72.576 kg)  BMI 28.34 kg/m2  LMP 11/22/2011  Review of Systems  See HPI above.    Objective:   Physical Exam  Gen: NAD  R shoulder: No swelling, ecchymoses.  No gross deformity. No TTP biceps tendon, AC joint, elsewhere about shoulder. FROM without painful arc. Negative Hawkins, Neers. Negative Speeds, Yergasons. Strength 5/5 with empty can and resisted internal/external rotation.  No pain with these motions. Negative apprehension. NV intact distally.  L shoulder: FROM without pain or weakness.     Assessment & Plan:  1. Right shoulder pain - 2/2 rotator cuff impingement.  Significant improvement since last visit.  Home exercise program for next 6 weeks then discontinue.  Mobic prn.  F/u prn.

## 2012-08-17 NOTE — Assessment & Plan Note (Signed)
2/2 rotator cuff impingement.  Significant improvement since last visit.  Home exercise program for next 6 weeks then discontinue.  Mobic prn.  F/u prn.

## 2012-08-18 ENCOUNTER — Ambulatory Visit: Payer: Managed Care, Other (non HMO) | Admitting: Physical Therapy

## 2012-09-24 ENCOUNTER — Other Ambulatory Visit: Payer: Self-pay | Admitting: Obstetrics and Gynecology

## 2012-09-24 DIAGNOSIS — Z0000 Encounter for general adult medical examination without abnormal findings: Secondary | ICD-10-CM

## 2012-10-02 ENCOUNTER — Ambulatory Visit (HOSPITAL_BASED_OUTPATIENT_CLINIC_OR_DEPARTMENT_OTHER): Payer: Managed Care, Other (non HMO)

## 2012-10-09 ENCOUNTER — Ambulatory Visit (HOSPITAL_BASED_OUTPATIENT_CLINIC_OR_DEPARTMENT_OTHER)
Admission: RE | Admit: 2012-10-09 | Discharge: 2012-10-09 | Disposition: A | Discharge status: Home / Self Care | Payer: Managed Care, Other (non HMO) | Source: Ambulatory Visit | Attending: Obstetrics and Gynecology | Admitting: Obstetrics and Gynecology

## 2012-10-09 DIAGNOSIS — Z1231 Encounter for screening mammogram for malignant neoplasm of breast: Secondary | ICD-10-CM | POA: Insufficient documentation

## 2012-10-09 DIAGNOSIS — Z0000 Encounter for general adult medical examination without abnormal findings: Secondary | ICD-10-CM

## 2012-12-17 LAB — HM PAP SMEAR

## 2013-06-11 ENCOUNTER — Other Ambulatory Visit: Payer: Self-pay | Admitting: *Deleted

## 2013-06-11 DIAGNOSIS — E063 Autoimmune thyroiditis: Secondary | ICD-10-CM

## 2013-07-08 ENCOUNTER — Ambulatory Visit: Payer: Managed Care, Other (non HMO) | Admitting: "Endocrinology

## 2013-07-27 ENCOUNTER — Other Ambulatory Visit: Payer: Self-pay | Admitting: *Deleted

## 2013-07-27 DIAGNOSIS — E038 Other specified hypothyroidism: Secondary | ICD-10-CM

## 2013-08-18 LAB — TSH: TSH: 2.646 u[IU]/mL (ref 0.350–4.500)

## 2013-08-18 LAB — T4, FREE: Free T4: 0.88 ng/dL (ref 0.80–1.80)

## 2013-08-18 LAB — T3, FREE: T3, Free: 2.6 pg/mL (ref 2.3–4.2)

## 2013-08-21 ENCOUNTER — Other Ambulatory Visit: Payer: Self-pay | Admitting: Internal Medicine

## 2013-08-25 ENCOUNTER — Ambulatory Visit (INDEPENDENT_AMBULATORY_CARE_PROVIDER_SITE_OTHER): Payer: BC Managed Care – PPO | Admitting: "Endocrinology

## 2013-08-25 ENCOUNTER — Encounter: Payer: Self-pay | Admitting: "Endocrinology

## 2013-08-25 ENCOUNTER — Encounter (INDEPENDENT_AMBULATORY_CARE_PROVIDER_SITE_OTHER): Payer: Self-pay

## 2013-08-25 VITALS — BP 132/89 | HR 67 | Wt 152.0 lb

## 2013-08-25 DIAGNOSIS — E049 Nontoxic goiter, unspecified: Secondary | ICD-10-CM

## 2013-08-25 DIAGNOSIS — E063 Autoimmune thyroiditis: Secondary | ICD-10-CM

## 2013-08-25 DIAGNOSIS — R946 Abnormal results of thyroid function studies: Secondary | ICD-10-CM

## 2013-08-25 DIAGNOSIS — I10 Essential (primary) hypertension: Secondary | ICD-10-CM

## 2013-08-25 MED ORDER — LOSARTAN POTASSIUM 25 MG PO TABS: ORAL_TABLET | ORAL | Status: DC

## 2013-08-25 NOTE — Patient Instructions (Addendum)
Follow up visit in one year. Please repeat thyroid blood tests in 6 and 12 months. Increase losartan to 1.5 of the 25 mg pills per day.

## 2013-08-25 NOTE — Progress Notes (Signed)
CC: FU thyroiditis, goiter, transient hyperthyroidism due to flare of thyroiditis, transient hypothyroidism due to flare of thyroiditis, hypertension  HPI: Wanda Herman is a 43 y.o. African-American woman. She was unaccompanied.  1. I have been following the patient since 01/04/05 for the above issues. In the subsequent 8 years I've seen Wanda Herman in clinic about once every 6 months. She has had multiple flare-ups of Hashimoto's Thyroiditis over the years that have resulted in her being hyperthyroid (Hashitoxicosis), hypothyroid, and euthyroid in between. Her TSH values have varied from 0.098-4.122. Free T4 values have varied from 0.60-1.23. Free T3 values have varied from 2.2-3.3. TPO antibody levels have always been elevated between  52.2-113.3. Fortunately, she has been euthyroid most of the time, so I have never treated her with Synthroid.   2. Her last [facility] visit was on 07/07/12. In the interim she has remained healthy, except for some seasonal allergy symptoms. She has been exercising more and trying to eat more healthy. She does use Restasis for her dry eyes and losartan for BP control.  3.Pertinent Review of Systems: Constitutional: The patient feels "overall good". She has been "pretty healthy".  Eyes: Her vision has been good since her lasik surgery four years ago. There are no significant eye complaints. Neck: The patient has no complaints of anterior neck swelling, soreness, tenderness,  pressure, discomfort, or difficulty swallowing.  Heart: Heart rate increases with exercise or other physical activity. The patient has no complaints of palpitations, irregular heat beats, chest pain, or chest pressure. Gastrointestinal: Bowel movents seem normal. She has no GI complaints. Legs: Muscle mass and strength seem normal. There are no complaints of numbness, tingling, burning, or pain. No edema is noted. Feet: There are no obvious foot problems. There are no complaints of numbness, tingling,  burning, or pain. No edema is noted. GYN: S/p partial hysterectomy as above.  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 2. Activities: She exercises more often and more consistently using DVDs at home. She's not a "gym person".   3. Tobacco, alcohol, and illicit drugs: Never 4. PCP: Dr. Larose Kells  REVIEW OF SYSTEMS: Wanda Herman has no other significant complaints involving any of her other body systems.  PHYSICAL EXAM: BP 132/89  Pulse 67  Wt 152 lb (68.947 kg)  LMP 11/22/2011  Her repeat BP was 134/92 She has lost 8 pounds and 2 sizes since last visit.  Constitutional: The patient looks great. She is healthy, slimmer, trimmer, and physically and emotionally well.  Eyes: There is no arcus or proptosis.  Mouth: The oropharynx appears normal. The tongue appears normal. There is normal oral moisture. There is no obvious gingivitis. Neck: There are no bruits present. Her strap muscles are larger, c/w upper body exercising. The thyroid gland appears somewhat enlarged in size. The thyroid gland is approximately 20-23 grams in size. The right lobe is within normal for size and consistency. The left lobe is more enlarged. The midportion of the left lobe is firmer in consistency today. There is no thyroid tenderness to palpation. Lungs: The lungs are clear. Air movement is good. Heart: The heart rhythm and rate appear normal. Heart sounds S1 and S2 are normal. I do not appreciate any pathologic heart murmurs. Abdomen: The abdomen is slightly enlarged. Bowel sounds are normal. The abdomen is soft and non-tender. There is no obviously palpable hepatomegaly, splenomegaly, or other masses.  Arms: Muscle mass appears appropriate  for age.  Hands: There is no obvious tremor. Phalangeal and metacarpophalangeal joints appear normal. Palms are normal. Legs: Muscle mass appears appropriate for age. There is no edema.    Neurologic: Muscle strength is normal for age and gender  in both the upper and the lower extremities. Muscle tone appears normal. Sensation to touch is normal in the legs.   Labs: 08/18/13: Normal TFTs (TSH 2.646, free T4 0.88, free T3 2.6) 06/30/12: Normal TFTs (TSH 1.508, free T4 1.01, free T3 2.7), normal CMP 07/06/11: Normal TFTs (TSH 2.674, free T4 1.23, free T3 2.4)  ASSESSMENT: 1. Goiter: The thyroid gland is a bit larger and firmer today,  w/w a relatively recent flare up of thyroiditis.  2. Abnormal TFTs/transient Hypothyroidism: She was mid-range euthyroid at last visit, but was at about the lower 20% of the normal thyroid hormone range one week ago. She does not need Synthroid treatment at this time. 3. Thyroiditis: Her Hashimoto's disease is clinically quiescent. However, the waxing and waning of thyroid gland size and the rather wide fluctuations in TFTs over time all indicate intermittent thyroiditis. 4. Hypertension: Her BP is higher today on her current dose of losartan.    PLAN: 1. Diagnostic: TFTs in 6 months and 12 months. CMP today. 2. Therapeutic: No Synthroid medication at this time. Increase losartan to 1.5 of the 25 mg tablets per day. Check BP at her clinic and schedule FU with Dr. Larose Kells. 3. Patient education: We discussed how Hashimoto's Dz will probably make her hypothyroid at some time in the future. 4. Follow-Up: FU appointment in 12 months with me.   Level of Service: This visit lasted in excess of 40 minutes. More than 50% of the visit was devoted to counseling.  Sherrlyn Hock

## 2013-09-17 ENCOUNTER — Other Ambulatory Visit: Payer: Self-pay | Admitting: Obstetrics and Gynecology

## 2013-09-17 DIAGNOSIS — Z1231 Encounter for screening mammogram for malignant neoplasm of breast: Secondary | ICD-10-CM

## 2013-09-23 DIAGNOSIS — H04123 Dry eye syndrome of bilateral lacrimal glands: Secondary | ICD-10-CM | POA: Insufficient documentation

## 2013-10-15 ENCOUNTER — Ambulatory Visit (HOSPITAL_BASED_OUTPATIENT_CLINIC_OR_DEPARTMENT_OTHER)
Admission: RE | Admit: 2013-10-15 | Discharge: 2013-10-15 | Disposition: A | Discharge status: Home / Self Care | Payer: BC Managed Care – PPO | Source: Ambulatory Visit | Attending: Obstetrics and Gynecology | Admitting: Obstetrics and Gynecology

## 2013-10-15 DIAGNOSIS — Z1231 Encounter for screening mammogram for malignant neoplasm of breast: Secondary | ICD-10-CM

## 2013-10-17 LAB — HM MAMMOGRAPHY

## 2014-02-03 ENCOUNTER — Telehealth: Payer: Self-pay | Admitting: *Deleted

## 2014-02-03 NOTE — Telephone Encounter (Signed)
Medication List and Allergies:Reviewed and updated  90 Day supply/Mail order: N/A Local pharmacy: [facility] [address] Immunizations Due: Tdap  A/P FH/PSH or Personal History: Reviewed and updated Flu Vaccine: 05/2010 Tdap: 08/2000 Td PNA: Never Shingles: Never Pap: Done at Gyn MMG:  08/2013 normal  To discuss with provider: Wants your opinion on body cleansing products.

## 2014-02-04 ENCOUNTER — Encounter: Payer: Self-pay | Admitting: Internal Medicine

## 2014-02-04 ENCOUNTER — Ambulatory Visit (INDEPENDENT_AMBULATORY_CARE_PROVIDER_SITE_OTHER): Payer: BC Managed Care – PPO | Admitting: Internal Medicine

## 2014-02-04 VITALS — BP 115/71 | HR 65 | Temp 98.2°F | Resp 16 | Ht 63.5 in | Wt 156.0 lb

## 2014-02-04 DIAGNOSIS — I10 Essential (primary) hypertension: Secondary | ICD-10-CM

## 2014-02-04 DIAGNOSIS — Z23 Encounter for immunization: Secondary | ICD-10-CM

## 2014-02-04 DIAGNOSIS — Z0000 Encounter for general adult medical examination without abnormal findings: Secondary | ICD-10-CM | POA: Insufficient documentation

## 2014-02-04 MED ORDER — LOSARTAN POTASSIUM 25 MG PO TABS: 25.0000 mg | ORAL_TABLET | Freq: Every day | ORAL | Status: DC

## 2014-02-04 NOTE — Patient Instructions (Signed)
Come back fasting for blood work: CMP, FLP, CBC -- dx v70  Check the  blood pressure 2 or 3 times a month  be sure it is between 110/60 and 140/85. Ideal blood pressure is 120/80. If it is consistently higher or lower, let me know  Take Prilosec 20 mg OTC one tablet in the morning for 2 or 3 weeks if you have more stomach issues. If the symptoms continue please let me know  Next visit in one year.

## 2014-02-04 NOTE — Assessment & Plan Note (Addendum)
Tdap: 08/2000 and today PNA: Never  Shingles: Never  Pap: Done at Gyn  MMG: 08/2013 normal Doing great with diet and exercise, praised . Check labs Return to the office in one year. Also complained of some heartburn, recommend Prilosec as needed, call if symptoms persist

## 2014-02-04 NOTE — Progress Notes (Signed)
Pre-visit discussion using our clinic review tool, if applicable. No additional management support is needed unless otherwise documented below in the visit note.

## 2014-02-04 NOTE — Assessment & Plan Note (Signed)
Patient is currently taking losartan 25 mg one tablet daily, ambulatory BPs usually okay, BP today normal.  Plan: No change

## 2014-02-04 NOTE — Progress Notes (Signed)
Subjective:    Patient ID: Wanda Herman, female    DOB: 03/06/1971, 43 y.o.   MRN: [id number]  DOS:  02/04/2014 Type of  Visit: CPX History: In general doing well, recently had heartburn for 2 weeks, mild, felt slightly bloated, no obvious triggers. She feels better now.   ROS No fever or chills Doing great with diet and exercise, has lost some weight. No chest pain, difficulty breathing, edema or palpitations. No vomiting, diarrhea, blood in the stools or abdominal pain. No respiratory symptoms such as cough or sputum production. Some stress at work but no anxiety or depression   Past Medical History  Diagnosis Date  . Thyroiditis, autoimmune   . Goiter   . Hypertension   . Hypothyroidism, acquired, autoimmune     no meds  . Gestational diabetes 2005  . Headache(784.0)   . H/O candidiasis   . H/O varicella   . Breast mass, right 05/30/04  . Breast mass, left 07/08/06  . Fibroid 08/21/2009    Past Surgical History  Procedure Laterality Date  . Lasik    . Abdominal hysterectomy  12/04/2011    Procedure: HYSTERECTOMY ABDOMINAL;  Surgeon: Delice Lesch, MD;  Location: [facility];  Service: Gynecology;  Laterality: N/A;  . Cystoscopy  12/04/2011    Procedure: CYSTOSCOPY;  Surgeon: Delice Lesch, MD;  Location: [facility];  Service: Gynecology;  Laterality: N/A;    History   Social History  . Marital Status: Married    Spouse Name: N/A    Number of Children: 2  . Years of Education: N/A   Occupational History  . accountant    Social History Main Topics  . Smoking status: Never Smoker   . Smokeless tobacco: Never Used  . Alcohol Use: No  . Drug Use: No  . Sexual Activity: Yes    Birth Control/ Protection: Pill     Comment: tri-sprinted   Other Topics Concern  . Not on file   Social History Narrative   Lives w/ husband and two children 37 and 55 y/o         Medication List       This list is accurate as of: 02/04/14 11:59 PM.  Always use your most  recent med list.               cycloSPORINE 0.05 % ophthalmic emulsion  Commonly known as:  RESTASIS  1 drop 2 (two) times daily.     losartan 25 MG tablet  Commonly known as:  COZAAR  Take 1 tablet (25 mg total) by mouth daily.       Family History  Problem Relation Age of Onset  . Diabetes Maternal Aunt   . Colon cancer Neg Hx   . Breast cancer Neg Hx   . Heart attack Other     GF at 43 y/o  . Diabetes Maternal Grandmother   . Hypertension Maternal Grandmother   . Ovarian cancer Maternal Aunt     ovarian cancer  . Diabetes Mother   . Hypertension Mother   . Hyperlipidemia Mother         Objective:   Physical Exam BP 115/71  Pulse 65  Temp(Src) 98.2 F (36.8 C) (Oral)  Resp 16  Ht 5' 3.5" (1.613 m)  Wt 156 lb (70.761 kg)  BMI 27.20 kg/m2  SpO2 99%  LMP 11/22/2011 General -- alert, well-developed, NAD.  Neck --no thyromegaly  HEENT-- Not pale.  Lungs -- normal respiratory effort,  no intercostal retractions, no accessory muscle use, and normal breath sounds.  Heart-- normal rate, regular rhythm, no murmur.  Abdomen-- Not distended, good bowel sounds,soft, non-tender. Extremities-- no pretibial edema bilaterally  Neurologic--  alert & oriented X3. Speech normal, gait appropriate for age, strength symmetric and appropriate for age.  Psych-- Cognition and judgment appear intact. Cooperative with normal attention span and concentration. No anxious or depressed appearing.          Assessment & Plan:

## 2014-02-05 ENCOUNTER — Telehealth: Payer: Self-pay | Admitting: Internal Medicine

## 2014-02-05 NOTE — Telephone Encounter (Signed)
Relevant patient education mailed to patient.  

## 2014-02-08 ENCOUNTER — Other Ambulatory Visit (INDEPENDENT_AMBULATORY_CARE_PROVIDER_SITE_OTHER): Payer: BC Managed Care – PPO

## 2014-02-08 ENCOUNTER — Other Ambulatory Visit: Payer: Self-pay | Admitting: Internal Medicine

## 2014-02-08 DIAGNOSIS — Z0000 Encounter for general adult medical examination without abnormal findings: Secondary | ICD-10-CM

## 2014-02-08 DIAGNOSIS — R739 Hyperglycemia, unspecified: Secondary | ICD-10-CM

## 2014-02-08 LAB — COMPREHENSIVE METABOLIC PANEL
ALBUMIN: 4.3 g/dL (ref 3.5–5.2)
ALT: 13 U/L (ref 0–35)
AST: 21 U/L (ref 0–37)
Alkaline Phosphatase: 56 U/L (ref 39–117)
BILIRUBIN TOTAL: 0.6 mg/dL (ref 0.2–1.2)
BUN: 11 mg/dL (ref 6–23)
CO2: 24 meq/L (ref 19–32)
Calcium: 8.9 mg/dL (ref 8.4–10.5)
Chloride: 106 mEq/L (ref 96–112)
Creatinine, Ser: 0.7 mg/dL (ref 0.4–1.2)
GFR: 109.92 mL/min (ref >60.00)
Glucose, Bld: 107 mg/dL — ABNORMAL HIGH (ref 70–99)
Potassium: 3.7 mEq/L (ref 3.5–5.1)
SODIUM: 139 meq/L (ref 135–145)
Total Protein: 7.1 g/dL (ref 6.0–8.3)

## 2014-02-08 LAB — CBC WITH DIFFERENTIAL/PLATELET
Basophils Absolute: 0 10*3/uL (ref 0.0–0.1)
Basophils Relative: 0.8 % (ref 0.0–3.0)
Eosinophils Absolute: 0.1 10*3/uL (ref 0.0–0.7)
Eosinophils Relative: 2.9 % (ref 0.0–5.0)
HEMATOCRIT: 36 % (ref 36.0–46.0)
Hemoglobin: 12 g/dL (ref 12.0–15.0)
LYMPHS ABS: 1.7 10*3/uL (ref 0.7–4.0)
LYMPHS PCT: 37.1 % (ref 12.0–46.0)
MCHC: 33.2 g/dL (ref 30.0–36.0)
MCV: 93 fl (ref 78.0–100.0)
Monocytes Absolute: 0.3 10*3/uL (ref 0.1–1.0)
Monocytes Relative: 7.7 % (ref 3.0–12.0)
Neutro Abs: 2.3 10*3/uL (ref 1.4–7.7)
Neutrophils Relative %: 51.5 % (ref 43.0–77.0)
PLATELETS: 273 10*3/uL (ref 150.0–400.0)
RBC: 3.87 Mil/uL (ref 3.87–5.11)
RDW: 13.1 % (ref 11.5–15.5)
WBC: 4.5 10*3/uL (ref 4.0–10.5)

## 2014-02-08 LAB — T4, FREE: FREE T4: 0.65 ng/dL (ref 0.60–1.60)

## 2014-02-08 LAB — TSH: TSH: 1.8 u[IU]/mL (ref 0.35–4.50)

## 2014-02-08 LAB — LIPID PANEL
Cholesterol: 171 mg/dL (ref 0–200)
HDL: 68.8 mg/dL (ref >39.00)
LDL Cholesterol: 90 mg/dL (ref 0–99)
NonHDL: 102.2
Total CHOL/HDL Ratio: 2
Triglycerides: 59 mg/dL (ref 0.0–149.0)
VLDL: 11.8 mg/dL (ref 0.0–40.0)

## 2014-02-08 LAB — T3, FREE: T3 FREE: 2.3 pg/mL (ref 2.3–4.2)

## 2014-02-11 ENCOUNTER — Other Ambulatory Visit (INDEPENDENT_AMBULATORY_CARE_PROVIDER_SITE_OTHER): Payer: BC Managed Care – PPO

## 2014-02-11 DIAGNOSIS — R739 Hyperglycemia, unspecified: Secondary | ICD-10-CM

## 2014-02-11 DIAGNOSIS — R7309 Other abnormal glucose: Secondary | ICD-10-CM

## 2014-02-11 LAB — HEMOGLOBIN A1C: Hgb A1c MFr Bld: 5.9 % (ref 4.6–6.5)

## 2014-06-20 ENCOUNTER — Encounter: Payer: Self-pay | Admitting: Internal Medicine

## 2014-09-21 ENCOUNTER — Other Ambulatory Visit (HOSPITAL_BASED_OUTPATIENT_CLINIC_OR_DEPARTMENT_OTHER): Payer: Self-pay | Admitting: Obstetrics and Gynecology

## 2014-09-21 DIAGNOSIS — Z1231 Encounter for screening mammogram for malignant neoplasm of breast: Secondary | ICD-10-CM

## 2014-09-30 ENCOUNTER — Encounter: Payer: Self-pay | Admitting: Internal Medicine

## 2014-09-30 ENCOUNTER — Ambulatory Visit (INDEPENDENT_AMBULATORY_CARE_PROVIDER_SITE_OTHER): Payer: BLUE CROSS/BLUE SHIELD | Admitting: Internal Medicine

## 2014-09-30 VITALS — BP 119/78 | HR 68 | Temp 98.3°F | Ht 64.0 in | Wt 157.2 lb

## 2014-09-30 DIAGNOSIS — R7303 Prediabetes: Secondary | ICD-10-CM

## 2014-09-30 DIAGNOSIS — R7309 Other abnormal glucose: Secondary | ICD-10-CM

## 2014-09-30 DIAGNOSIS — R1013 Epigastric pain: Secondary | ICD-10-CM | POA: Insufficient documentation

## 2014-09-30 DIAGNOSIS — R739 Hyperglycemia, unspecified: Secondary | ICD-10-CM | POA: Insufficient documentation

## 2014-09-30 DIAGNOSIS — E119 Type 2 diabetes mellitus without complications: Secondary | ICD-10-CM | POA: Insufficient documentation

## 2014-09-30 DIAGNOSIS — E049 Nontoxic goiter, unspecified: Secondary | ICD-10-CM

## 2014-09-30 HISTORY — DX: Prediabetes: R73.03

## 2014-09-30 LAB — HEMOGLOBIN A1C: HEMOGLOBIN A1C: 6.1 % (ref 4.6–6.5)

## 2014-09-30 NOTE — Patient Instructions (Signed)
Get your blood work before you leave    try a probiotic daily (such as Align) Metamucil capsule 2 a day with fluids    Please come back to the office by 01-2015  for a physical exam. Come back fasting

## 2014-09-30 NOTE — Progress Notes (Signed)
Subjective:    Patient ID: Wanda Herman, female    DOB: 01/21/1971, 44 y.o.   MRN: [id number]  DOS:  09/30/2014 Type of visit - description : rov Interval history: Last time A1c was a slightly elevated, here for a checkup Today she also complaining of stomach problems, has a difficult time describing it, occasional feels bloated at the upper abdomen. Denies any pain per se, no classic heartburn, no fever chills. Bowel movements are irregular, sometimes only 3 or 4 times a week. Diet and exercise are very good. She is very conscious about her health   Review of Systems See history of present illness  Past Medical History  Diagnosis Date  . Thyroiditis, autoimmune   . Goiter   . Hypertension   . Hypothyroidism, acquired, autoimmune     no meds  . Gestational diabetes 2005  . Headache(784.0)   . H/O candidiasis   . H/O varicella   . Breast mass, right 05/30/04  . Breast mass, left 07/08/06  . Fibroid 08/21/2009    Past Surgical History  Procedure Laterality Date  . Lasik    . Abdominal hysterectomy  12/04/2011    Procedure: HYSTERECTOMY ABDOMINAL;  Surgeon: Delice Lesch, MD;  Location: [facility];  Service: Gynecology;  Laterality: N/A;  . Cystoscopy  12/04/2011    Procedure: CYSTOSCOPY;  Surgeon: Delice Lesch, MD;  Location: [facility];  Service: Gynecology;  Laterality: N/A;    History   Social History  . Marital Status: Married    Spouse Name: N/A  . Number of Children: 2  . Years of Education: N/A   Occupational History  . accountant    Social History Main Topics  . Smoking status: Never Smoker   . Smokeless tobacco: Never Used  . Alcohol Use: No  . Drug Use: No  . Sexual Activity: Yes    Birth Control/ Protection: Pill     Comment: tri-sprinted   Other Topics Concern  . Not on file   Social History Narrative   Lives w/ husband and two children 11 and 75 y/o         Medication List       This list is accurate as of: 09/30/14 11:59 PM.  Always  use your most recent med list.               cycloSPORINE 0.05 % ophthalmic emulsion  Commonly known as:  RESTASIS  1 drop 2 (two) times daily.     losartan 25 MG tablet  Commonly known as:  COZAAR  Take 1 tablet (25 mg total) by mouth daily.           Objective:   Physical Exam  Constitutional: She is oriented to person, place, and time. She appears well-developed. No distress.  HENT:  Head: Normocephalic and atraumatic.  Cardiovascular:  RRR, no murmur , rub or gallop  Pulmonary/Chest: Effort normal. No respiratory distress.  CTA B  Abdominal: Soft. Bowel sounds are normal. She exhibits no distension and no mass. There is no tenderness. There is no rebound and no guarding.  No organomegaly  Musculoskeletal: She exhibits no edema or tenderness.  Neurological: She is alert and oriented to person, place, and time. No cranial nerve deficit. She exhibits normal muscle tone. Coordination normal.  Speech normal, gait unassisted and normal for age, motor strength appropriate for age   Skin: Skin is warm and dry. No pallor.  No jaundice  Psychiatric: She has a normal mood  and affect. Her behavior is normal. Judgment and thought content normal.  Vitals reviewed.        Assessment & Plan:   Problem List Items Addressed This Visit      Endocrine   Prediabetes - Primary   Relevant Orders   Hemoglobin A1c (Completed)     Other   Dyspepsia

## 2014-09-30 NOTE — Progress Notes (Signed)
Pre visit review using our clinic review tool, if applicable. No additional management support is needed unless otherwise documented below in the visit note. 

## 2014-10-01 NOTE — Assessment & Plan Note (Signed)
Had a A1c of 5.9, consequently has prediabetes, dx  discussed, recommend to continue with to healthy lifestyle and will check labs.

## 2014-10-01 NOTE — Assessment & Plan Note (Signed)
Goiter, due to see endocrinology, states she will call.

## 2014-10-01 NOTE — Assessment & Plan Note (Signed)
Ill-defined GI symptoms, for now will recommend a trial with probiotics and fiber, recommend to increase her fruit intake (constipation?). If she is not improving, she will call for a GI referral

## 2014-10-06 ENCOUNTER — Other Ambulatory Visit: Payer: Self-pay | Admitting: *Deleted

## 2014-10-06 ENCOUNTER — Telehealth: Payer: Self-pay | Admitting: "Endocrinology

## 2014-10-06 DIAGNOSIS — E034 Atrophy of thyroid (acquired): Secondary | ICD-10-CM

## 2014-10-06 NOTE — Telephone Encounter (Signed)
Orders placed in portal. KW

## 2014-10-15 LAB — TSH: TSH: 1.925 u[IU]/mL (ref 0.350–4.500)

## 2014-10-15 LAB — T3, FREE: T3, Free: 2.8 pg/mL (ref 2.3–4.2)

## 2014-10-15 LAB — T4, FREE: Free T4: 0.99 ng/dL (ref 0.80–1.80)

## 2014-10-17 ENCOUNTER — Ambulatory Visit (HOSPITAL_BASED_OUTPATIENT_CLINIC_OR_DEPARTMENT_OTHER)
Admission: RE | Admit: 2014-10-17 | Discharge: 2014-10-17 | Disposition: A | Discharge status: Home / Self Care | Payer: BLUE CROSS/BLUE SHIELD | Source: Ambulatory Visit | Attending: Obstetrics and Gynecology | Admitting: Obstetrics and Gynecology

## 2014-10-17 DIAGNOSIS — Z1231 Encounter for screening mammogram for malignant neoplasm of breast: Secondary | ICD-10-CM | POA: Insufficient documentation

## 2014-10-17 LAB — HM MAMMOGRAPHY

## 2014-10-17 LAB — HM PAP SMEAR

## 2014-10-25 ENCOUNTER — Ambulatory Visit: Payer: BLUE CROSS/BLUE SHIELD | Admitting: "Endocrinology

## 2014-10-26 ENCOUNTER — Other Ambulatory Visit: Payer: Self-pay | Admitting: Obstetrics and Gynecology

## 2014-10-26 DIAGNOSIS — R928 Other abnormal and inconclusive findings on diagnostic imaging of breast: Secondary | ICD-10-CM

## 2014-10-31 ENCOUNTER — Ambulatory Visit: Payer: BLUE CROSS/BLUE SHIELD | Admitting: Pediatrics

## 2014-11-03 ENCOUNTER — Encounter (INDEPENDENT_AMBULATORY_CARE_PROVIDER_SITE_OTHER): Payer: Self-pay

## 2014-11-03 ENCOUNTER — Ambulatory Visit
Admission: RE | Admit: 2014-11-03 | Discharge: 2014-11-03 | Disposition: A | Discharge status: Home / Self Care | Payer: BLUE CROSS/BLUE SHIELD | Source: Ambulatory Visit | Attending: Obstetrics and Gynecology | Admitting: Obstetrics and Gynecology

## 2014-11-03 DIAGNOSIS — R928 Other abnormal and inconclusive findings on diagnostic imaging of breast: Secondary | ICD-10-CM

## 2014-11-17 ENCOUNTER — Other Ambulatory Visit: Payer: Self-pay | Admitting: *Deleted

## 2014-11-17 DIAGNOSIS — E034 Atrophy of thyroid (acquired): Secondary | ICD-10-CM

## 2014-11-29 ENCOUNTER — Telehealth: Payer: Self-pay | Admitting: Internal Medicine

## 2014-11-29 MED ORDER — LOSARTAN POTASSIUM 25 MG PO TABS: 25.0000 mg | ORAL_TABLET | Freq: Every day | ORAL | Status: DC

## 2014-11-29 NOTE — Telephone Encounter (Signed)
Rx sent to [facility] on [address].

## 2014-11-29 NOTE — Telephone Encounter (Signed)
Caller name: Savita Relation to pt: self Call back number: [phone] Pharmacy:  Reason for call:   Requesting that losartan be refill at [facility] on [address]. This is her new pharmacy

## 2014-11-30 ENCOUNTER — Other Ambulatory Visit: Payer: Self-pay | Admitting: *Deleted

## 2014-11-30 DIAGNOSIS — I10 Essential (primary) hypertension: Secondary | ICD-10-CM

## 2014-11-30 MED ORDER — LOSARTAN POTASSIUM 25 MG PO TABS: ORAL_TABLET | ORAL | Status: DC

## 2014-12-12 ENCOUNTER — Ambulatory Visit (INDEPENDENT_AMBULATORY_CARE_PROVIDER_SITE_OTHER): Payer: BLUE CROSS/BLUE SHIELD | Admitting: "Endocrinology

## 2014-12-12 ENCOUNTER — Encounter: Payer: Self-pay | Admitting: "Endocrinology

## 2014-12-12 VITALS — BP 122/78 | HR 71 | Wt 154.3 lb

## 2014-12-12 DIAGNOSIS — R7309 Other abnormal glucose: Secondary | ICD-10-CM | POA: Diagnosis not present

## 2014-12-12 DIAGNOSIS — E049 Nontoxic goiter, unspecified: Secondary | ICD-10-CM | POA: Diagnosis not present

## 2014-12-12 DIAGNOSIS — R5383 Other fatigue: Secondary | ICD-10-CM

## 2014-12-12 DIAGNOSIS — I10 Essential (primary) hypertension: Secondary | ICD-10-CM | POA: Diagnosis not present

## 2014-12-12 DIAGNOSIS — R7303 Prediabetes: Secondary | ICD-10-CM

## 2014-12-12 DIAGNOSIS — E063 Autoimmune thyroiditis: Secondary | ICD-10-CM

## 2014-12-12 LAB — CBC
HCT: 38 % (ref 36.0–46.0)
Hemoglobin: 13.2 g/dL (ref 12.0–15.0)
MCH: 31.1 pg (ref 26.0–34.0)
MCHC: 34.7 g/dL (ref 30.0–36.0)
MCV: 89.6 fL (ref 78.0–100.0)
MPV: 10.6 fL (ref 8.6–12.4)
Platelets: 307 10*3/uL (ref 150–400)
RBC: 4.24 MIL/uL (ref 3.87–5.11)
RDW: 13.5 % (ref 11.5–15.5)
WBC: 5.7 10*3/uL (ref 4.0–10.5)

## 2014-12-12 LAB — IRON: IRON: 83 ug/dL (ref 42–145)

## 2014-12-12 LAB — COMPREHENSIVE METABOLIC PANEL
ALK PHOS: 58 U/L (ref 39–117)
ALT: 13 U/L (ref 0–35)
AST: 18 U/L (ref 0–37)
Albumin: 4.6 g/dL (ref 3.5–5.2)
BUN: 11 mg/dL (ref 6–23)
CALCIUM: 9.5 mg/dL (ref 8.4–10.5)
CHLORIDE: 103 meq/L (ref 96–112)
CO2: 28 mEq/L (ref 19–32)
Creat: 0.76 mg/dL (ref 0.50–1.10)
Glucose, Bld: 108 mg/dL — ABNORMAL HIGH (ref 70–99)
Potassium: 4.2 mEq/L (ref 3.5–5.3)
Sodium: 139 mEq/L (ref 135–145)
Total Bilirubin: 0.4 mg/dL (ref 0.2–1.2)
Total Protein: 7.2 g/dL (ref 6.0–8.3)

## 2014-12-12 MED ORDER — LOSARTAN POTASSIUM 25 MG PO TABS: ORAL_TABLET | ORAL | Status: DC

## 2014-12-12 NOTE — Progress Notes (Signed)
CC: FU thyroiditis, goiter, transient hyperthyroidism due to flare up of thyroiditis, transient hypothyroidism due to flare up of thyroiditis, hypertension  HPI: Wanda Herman is a 44 y.o. African-American woman. She was unaccompanied.  1. I have been following the patient since 01/04/05 for the above issues. In the subsequent 10 years I've seen Wanda Herman in clinic about once every 6 months. She has had multiple flare-ups of Hashimoto's Thyroiditis over the years that have resulted in her being hyperthyroid (Hashitoxicosis), hypothyroid, and euthyroid at different times in between. Her TSH values have varied from 0.098-4.122. Free T4 values have varied from 0.60-1.23. Free T3 values have varied from 2.2-3.3. TPO antibody levels have always been elevated between  52.2-113.3. Fortunately, she has been euthyroid most of the time, so I have never treated her with Synthroid.   2. Her last [facility] visit was on 08/25/13. In the interim she has remained healthy, except for some seasonal allergy symptoms. She has been exercising more and trying to eat more healthy. She does use Restasis for her dry eyes and takes losartan for BP control. For some reason that she can't remember now, she reduced her losartan from 1.5 pills per day to 1.0 pill per day.   3.Pertinent Review of Systems: Constitutional: The patient feels "up and down". She has been more tired at times, especially in the mornings. She sleeps well most of the time. She does not take in much caffeine.   Eyes: Her vision has been good since her lasik surgery four years ago. There are no significant eye complaints. Neck: The patient has no complaints of anterior neck swelling, soreness, tenderness,  pressure, discomfort, or difficulty swallowing.  Heart: Heart rate increases with exercise or other physical activity. The patient has no complaints of palpitations, irregular heat beats, chest pain, or chest pressure. Gastrointestinal: Bowel movents seem normal.  She has no GI complaints. Legs: Muscle mass and strength seem normal. There are no complaints of numbness, tingling, burning, or pain. No edema is noted. Feet: There are no obvious foot problems. There are no complaints of numbness, tingling, burning, or pain. No edema is noted. GYN: S/p partial hysterectomy as above. Skin: She feels the darker coloration of her lateral face is about the same. It seems more prominent on the right side to me.   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  2. Activities: She exercises more often and more consistently using DVDs at home. She's not a "gym person".   3. Tobacco, alcohol, and illicit drugs: Never 4. PCP: Dr. Larose Kells  REVIEW OF SYSTEMS: [name] has no other significant complaints involving any of her other body systems.  PHYSICAL EXAM: BP 122/78 mmHg  Pulse 71  Wt 154 lb 4.8 oz (69.99 kg)  LMP 11/22/2011   Constitutional: The patient looks more tired today. Her affect is normal, but she is not the perky and upbeat person she was at her last visit. She has gained 2 pounds since last visit. Her clothes have not changed in size. Face: She has darker coloration of the right side of her face, especially the area lateral to her right orbit, both above the orbit and below the orbit.    Eyes: There is no arcus or proptosis.  Mouth: The oropharynx appears normal. The tongue appears normal. There is normal oral moisture. There is no obvious gingivitis. Neck: There are no bruits present. Her strap muscles are  smaller, c/w less upper body exercise. The thyroid gland appears somewhat enlarged in size. The thyroid gland is larger at about 23-24 grams in size. The right lobe is mildly enlarged, the left lobe larger and firmer. The midportion of the left lower pole is firmer in consistency today. There is no thyroid tenderness to palpation. Lungs: The lungs are clear. Air  movement is good. Heart: The heart rhythm and rate appear normal. Heart sounds S1 and S2 are normal. I do not appreciate any pathologic heart murmurs. Abdomen: The abdomen is slightly enlarged. Bowel sounds are normal. The abdomen is soft and non-tender. There is no obviously palpable hepatomegaly, splenomegaly, or other masses.  Arms: Muscle mass appears appropriate for age.  Hands: There is no obvious tremor. Phalangeal and metacarpophalangeal joints appear normal. Palms are normal. Legs: Muscle mass appears appropriate for age. There is no edema.   Neurologic: Muscle strength is normal for age and gender  in both the upper and the lower extremities. Muscle tone appears normal. Sensation to touch is normal in the legs.   Labs:  10/14/14: TSH 1.925, free T4 0.99, free T3 2.8  09/30/14: HbA1c was 6.1%.   02/08/14: CMP normal; TSH 1.80, free T4 0.65, free T3 2.3; Hgb 12.0, Hct 36; cholesterol 171, triglycerides 59, HDL 688, LDL 90;   08/18/13: Normal TFTs (TSH 2.646, free T4 0.88, free T3 2.6)  06/30/12: Normal TFTs (TSH 1.508, free T4 1.01, free T3 2.7), normal CMP  07/06/11: Normal TFTs (TSH 2.674, free T4 1.23, free T3 2.4)  ASSESSMENT: 1. Goiter: The thyroid gland is a bit larger and firmer today,  c/w a relatively recent flare up of thyroiditis. From December 2014 to June 2015 all three TFTs decreased in parallel. From June 2015 to February 2016 all three TFTs increased in parallel. These shifts, in which all three of the TFTs shift upward or downward in the same direction, are pathognomonic for interim flare ups of hashimoto's disease.  2. Abnormal TFTs/transient Hypothyroidism: She was mid-range euthyroid in February. She does not need Synthroid treatment at this time. 3. Thyroiditis: Her Hashimoto's disease is clinically quiescent. However, the waxing and waning of thyroid gland size and the rather wide fluctuations in TFTs over time all indicate intermittent thyroiditis. 4.  Hypertension: Her DBP is higher today on her current dose of losartan.  In retrospect, she did miss some doses recently. She also needs to resume her prior losartan dose of 1.5 of the 25 mg pills per day.  5. Prediabetes: She does have pre-diabetes  And should begin treatment. Since we know that she has autoimmune thyroiditis, it is also possible that she is developing Latent Autoimmune Diabetes of Adults (LADA). We need to assess her C-peptide.  6. Fatigue, other:  She is euthyroid. Her labs in June 2015 were borderline anemic. We need to re-assess the issue of anemia, which she has had before.   PLAN: 1. Diagnostic: CMP, CBC, iron, and C-peptide today. 2. Therapeutic: No Synthroid medication at this time. Increase losartan to 1.5 of the 25 mg tablets per day. Start metformin, 500 mg, twice daily, but for the first 1-2 weeks, take only one pill at dinner and no pill at breakfast.  Check BP at her clinic and schedule FU with Dr. Larose Kells. 3. Patient education: We discussed how Hashimoto's Dz will probably make her hypothyroid at some time in the future. 4. Follow-Up: FU appointment in 4 months with me.   Level of Service: This visit lasted in excess  of 50 minutes. More than 50% of the visit was devoted to counseling.  Sherrlyn Hock

## 2014-12-12 NOTE — Patient Instructions (Signed)
Follow up visit in 4 months.  

## 2014-12-13 LAB — C-PEPTIDE: C PEPTIDE: 6.28 ng/mL — AB (ref 0.80–3.90)

## 2014-12-13 MED ORDER — METFORMIN HCL 500 MG PO TABS: ORAL_TABLET | ORAL | Status: DC

## 2014-12-21 ENCOUNTER — Encounter: Payer: Self-pay | Admitting: *Deleted

## 2014-12-29 LAB — HM DIABETES EYE EXAM

## 2015-01-05 ENCOUNTER — Other Ambulatory Visit: Payer: Self-pay

## 2015-01-23 ENCOUNTER — Telehealth: Payer: Self-pay | Admitting: Internal Medicine

## 2015-01-23 NOTE — Telephone Encounter (Signed)
Pre Visit letter sent  °

## 2015-02-09 ENCOUNTER — Encounter: Payer: Self-pay | Admitting: Behavioral Health

## 2015-02-09 ENCOUNTER — Telehealth: Payer: Self-pay | Admitting: Behavioral Health

## 2015-02-09 NOTE — Telephone Encounter (Signed)
Pre-Visit Call completed with patient and chart updated.   Pre-Visit Info documented in Specialty Comments under [employer].    

## 2015-02-10 ENCOUNTER — Ambulatory Visit (INDEPENDENT_AMBULATORY_CARE_PROVIDER_SITE_OTHER): Payer: BLUE CROSS/BLUE SHIELD | Admitting: Internal Medicine

## 2015-02-10 ENCOUNTER — Encounter: Payer: Self-pay | Admitting: Internal Medicine

## 2015-02-10 VITALS — BP 122/68 | HR 55 | Temp 98.2°F | Ht 64.0 in | Wt 156.1 lb

## 2015-02-10 DIAGNOSIS — Z0000 Encounter for general adult medical examination without abnormal findings: Secondary | ICD-10-CM | POA: Diagnosis not present

## 2015-02-10 DIAGNOSIS — R7309 Other abnormal glucose: Secondary | ICD-10-CM | POA: Diagnosis not present

## 2015-02-10 DIAGNOSIS — R7303 Prediabetes: Secondary | ICD-10-CM

## 2015-02-10 DIAGNOSIS — R1013 Epigastric pain: Secondary | ICD-10-CM

## 2015-02-10 LAB — LIPID PANEL
Cholesterol: 195 mg/dL (ref 0–200)
HDL: 67.1 mg/dL (ref >39.00)
LDL Cholesterol: 113 mg/dL — ABNORMAL HIGH (ref 0–99)
NonHDL: 127.9
Total CHOL/HDL Ratio: 3
Triglycerides: 76 mg/dL (ref 0.0–149.0)
VLDL: 15.2 mg/dL (ref 0.0–40.0)

## 2015-02-10 LAB — BASIC METABOLIC PANEL
BUN: 14 mg/dL (ref 6–23)
CALCIUM: 9.4 mg/dL (ref 8.4–10.5)
CO2: 29 meq/L (ref 19–32)
Chloride: 101 mEq/L (ref 96–112)
Creatinine, Ser: 0.79 mg/dL (ref 0.40–1.20)
GFR: 101.46 mL/min (ref >60.00)
GLUCOSE: 87 mg/dL (ref 70–99)
Potassium: 4.2 mEq/L (ref 3.5–5.1)
Sodium: 137 mEq/L (ref 135–145)

## 2015-02-10 LAB — HIV ANTIBODY (ROUTINE TESTING W REFLEX): HIV 1&2 Ab, 4th Generation: NONREACTIVE

## 2015-02-10 LAB — AST: AST: 19 U/L (ref 0–37)

## 2015-02-10 LAB — HEMOGLOBIN A1C: Hgb A1c MFr Bld: 5.8 % (ref 4.6–6.5)

## 2015-02-10 LAB — ALT: ALT: 12 U/L (ref 0–35)

## 2015-02-10 NOTE — Progress Notes (Signed)
Pre visit review using our clinic review tool, if applicable. No additional management support is needed unless otherwise documented below in the visit note. 

## 2015-02-10 NOTE — Patient Instructions (Signed)
Please do your blood work before you leave  Increase Metamucil to twice a day Use a OTC probiotic daily

## 2015-02-10 NOTE — Progress Notes (Signed)
Subjective:    Patient ID: Wanda Herman, female    DOB: 1971-08-02, 44 y.o.   MRN: [id number]  DOS:  02/10/2015 Type of visit - description : Complete physical exam Interval history:  Since the last time, saw Dr. Tobe Sos, note reviewed   Review of Systems Constitutional: No fever. No chills. No unexplained wt changes. No unusual sweats  HEENT: No dental problems, no ear discharge, no facial swelling, no voice changes. No eye discharge, no eye  redness , no  intolerance to light   Respiratory: No wheezing . No cough , no mucus production. Occasionally DOE only if she over exercises during a very humid day  Cardiovascular: No CP, no leg swelling , no  Palpitations  GI: Continue with the ill-defined dyspepsia, slightly worse since she started metformin: Mild nausea, denies diarrhea/constipation but feels bloated. No blood in the stools.    Endocrine: No polyphagia, no polyuria , no polydipsia  GU: No dysuria, gross hematuria, difficulty urinating. No urinary urgency, no frequency.  Musculoskeletal: No joint swellings or unusual aches or pains  Skin: No change in the color of the skin, palor , no  Rash  Allergic, immunologic: No environmental allergies , no  food allergies  Neurological: No dizziness no  syncope. No headaches. No diplopia, no slurred, no slurred speech, no motor deficits, no facial  Numbness  Hematological: No enlarged lymph nodes, no easy bruising , no unusual bleedings  Psychiatry: No suicidal ideas, no hallucinations, no beavior problems, no confusion.  No unusual/severe anxiety, no depression    Past Medical History  Diagnosis Date  . Thyroiditis, autoimmune   . Goiter   . Hypertension   . Gestational diabetes 2005  . Headache(784.0)   . H/O candidiasis   . H/O varicella   . Breast mass, right 05/30/04  . Breast mass, left 07/08/06  . Fibroid 08/21/2009  . Prediabetes 09/30/2014    Past Surgical History  Procedure Laterality Date  . Lasik     . Abdominal hysterectomy  12/04/2011    Procedure: HYSTERECTOMY ABDOMINAL;  Surgeon: Delice Lesch, MD;  Location: [facility];  Service: Gynecology;  Laterality: N/A;  . Cystoscopy  12/04/2011    Procedure: CYSTOSCOPY;  Surgeon: Delice Lesch, MD;  Location: [facility];  Service: Gynecology;  Laterality: N/A;    History   Social History  . Marital Status: Married    Spouse Name: N/A  . Number of Children: 2  . Years of Education: N/A   Occupational History  . accountant    Social History Main Topics  . Smoking status: Never Smoker   . Smokeless tobacco: Never Used  . Alcohol Use: No  . Drug Use: No  . Sexual Activity: Yes    Birth Control/ Protection: Pill     Comment: tri-sprinted   Other Topics Concern  . Not on file   Social History Narrative   Lives w/ husband and two children 11 and 23 y/o      Family History  Problem Relation Age of Onset  . Diabetes Maternal Aunt   . Colon cancer Neg Hx   . Breast cancer Neg Hx   . Heart attack Other     GF at 44 y/o  . Diabetes Maternal Grandmother   . Hypertension Maternal Grandmother   . Ovarian cancer Maternal Aunt     ovarian cancer  . Diabetes Mother   . Hypertension Mother   . Hyperlipidemia Mother  Medication List       This list is accurate as of: 02/10/15 11:59 PM.  Always use your most recent med list.               cycloSPORINE 0.05 % ophthalmic emulsion  Commonly known as:  RESTASIS  1 drop 2 (two) times daily.     losartan 25 MG tablet  Commonly known as:  COZAAR  Take 37.5 mg by mouth daily.     metFORMIN 500 MG tablet  Commonly known as:  GLUCOPHAGE  Take one tablet twice daily at mealtimes.           Objective:   Physical Exam BP 122/68 mmHg  Pulse 55  Temp(Src) 98.2 F (36.8 C) (Oral)  Ht 5\' 4"  (1.626 m)  Wt 156 lb 2 oz (70.818 kg)  BMI 26.79 kg/m2  SpO2 99%  LMP 11/22/2011  General:   Well developed, well nourished . NAD.  Neck:  Full range of motion. Supple. No   thyromegaly , normal carotid pulse HEENT:  Normocephalic . Face symmetric, atraumatic Lungs:  CTA B Normal respiratory effort, no intercostal retractions, no accessory muscle use. Heart: RRR,  no murmur.  No pretibial edema bilaterally  Abdomen:  Not distended, soft, non-tender. No rebound or rigidity. No mass,organomegaly Skin: Exposed areas without rash. Not pale. Not jaundice Neurologic:  alert & oriented X3.  Speech normal, gait appropriate for age and unassisted Strength symmetric and appropriate for age.  Psych: Cognition and judgment appear intact.  Cooperative with normal attention span and concentration.  Behavior appropriate. No anxious or depressed appearing.      Assessment & Plan:

## 2015-02-10 NOTE — Assessment & Plan Note (Addendum)
Tdap:2014 PNA: Never  Shingles: Never   Pap-- 01/2014 with Dr. Everett Graff at [facility]-- 10/17/14, [facility], bi-rads category 0  Doing great with diet   Exercises regulalrly . Check labs: BMP, AST, ALT, FLP, A1c, HIV

## 2015-02-10 NOTE — Assessment & Plan Note (Signed)
Taking Metamucil once a day, symptoms were temporarily slightly worse when she started metformin but they are going back to baseline. Did not try probiotics Plan: Increase Metamucil dose to twice a day, probiotics, reassess in 6 months.

## 2015-02-10 NOTE — Assessment & Plan Note (Addendum)
Doing well with diet and exercise, recently Dr. Tobe Sos  started metformin, check labs. Currently tolerating metformin once a day, recommend to gradually increase to twice a day

## 2015-04-11 ENCOUNTER — Encounter: Payer: Self-pay | Admitting: "Endocrinology

## 2015-04-11 ENCOUNTER — Ambulatory Visit (INDEPENDENT_AMBULATORY_CARE_PROVIDER_SITE_OTHER): Payer: BLUE CROSS/BLUE SHIELD | Admitting: "Endocrinology

## 2015-04-11 VITALS — BP 128/91 | HR 75 | Wt 157.5 lb

## 2015-04-11 DIAGNOSIS — R7309 Other abnormal glucose: Secondary | ICD-10-CM

## 2015-04-11 DIAGNOSIS — E063 Autoimmune thyroiditis: Secondary | ICD-10-CM

## 2015-04-11 DIAGNOSIS — R5383 Other fatigue: Secondary | ICD-10-CM

## 2015-04-11 DIAGNOSIS — R7303 Prediabetes: Secondary | ICD-10-CM

## 2015-04-11 DIAGNOSIS — R1013 Epigastric pain: Secondary | ICD-10-CM

## 2015-04-11 DIAGNOSIS — E049 Nontoxic goiter, unspecified: Secondary | ICD-10-CM

## 2015-04-11 LAB — POCT GLYCOSYLATED HEMOGLOBIN (HGB A1C): HEMOGLOBIN A1C: 5.6

## 2015-04-11 LAB — GLUCOSE, POCT (MANUAL RESULT ENTRY): POC GLUCOSE: 80 mg/dL (ref 70–99)

## 2015-04-11 LAB — T3, FREE: T3, Free: 2.7 pg/mL (ref 2.3–4.2)

## 2015-04-11 LAB — T4, FREE: FREE T4: 1 ng/dL (ref 0.80–1.80)

## 2015-04-11 LAB — TSH: TSH: 3.012 u[IU]/mL (ref 0.350–4.500)

## 2015-04-11 MED ORDER — RANITIDINE HCL 150 MG PO TABS: 150.0000 mg | ORAL_TABLET | Freq: Two times a day (BID) | ORAL | Status: DC

## 2015-04-11 NOTE — Patient Instructions (Signed)
Follow up visit in 4 months. Please repeat lab tests one week prior to next visit. 

## 2015-04-11 NOTE — Progress Notes (Signed)
CC: FU thyroiditis, goiter, transient hyperthyroidism due to flare up of thyroiditis, transient hypothyroidism due to flare up of thyroiditis, hypertension  HPI: [name] is a 44 y.o. African-American woman. She was accompanied by her daughter.  1. I have been following the patient since 01/04/05 for the above issues. In the subsequent 10 years I've seen Ms. Rasor in clinic about once every 6 months. She has had multiple flare-ups of Hashimoto's Thyroiditis over the years that have resulted in her being hyperthyroid (Hashitoxicosis), hypothyroid, and euthyroid at different times in between. Her TSH values have varied from 0.098-4.122. Free T4 values have varied from 0.60-1.23. Free T3 values have varied from 2.2-3.3. TPO antibody levels have always been elevated between  52.2-113.3. Fortunately, she has been euthyroid most of the time, so I have never treated her with Synthroid.   2. Her last [facility] visit was on 12/16/14. In the interim she has remained healthy, except for some seasonal allergy symptoms. She hates the metformin because it makes her sick, nauseous all day. She also has more belly hunger. She has not been exercising as much due to feeling bad with the metformin. She still uses Restasis for her dry eyes and takes losartan for BP control.   3.Pertinent Review of Systems: Constitutional: She has been more tired throughout the day. She is not sleeping well, mostly due to mental stress. She does not use caffeine. She also feels very hot intermittently.   Eyes: Her vision has been good since her lasik surgery four years ago. There are no significant eye complaints. Neck: The patient has no complaints of anterior neck swelling, soreness, tenderness,  pressure, discomfort, or difficulty swallowing.  Heart: Heart rate increases with exercise or other physical activity. The patient has no complaints of palpitations, irregular heat beats, chest pain, or chest pressure. Gastrointestinal: As  above. She is having more frequent, looser stools. She has no other GI complaints. Legs: Muscle mass and strength seem normal. There are no complaints of numbness, tingling, burning, or pain. No edema is noted. Feet: There are no obvious foot problems. There are no complaints of numbness, tingling, burning, or pain. No edema is noted. GYN: S/p partial hysterectomy in 2013. She still has PMS symptoms in some months.  Skin: She feels the darker coloration of her lateral face is markedly reduced.   PAST MEDICAL, FAMILY, AND SOCIAL HISTORY: 1. 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 Ms. Kernen 's mother had menopause in her early 102s, but had premenopausal symptoms beginning in her 49s. 2. Activities: She has not been exercising as often recently. She's not a "gym person".   3. Tobacco, alcohol, and illicit drugs: Never 4. PCP: Dr. Larose Kells  REVIEW OF SYSTEMS: Ms. Brigandi has no other significant complaints involving any of her other body systems.  PHYSICAL EXAM: BP 128/91 mmHg  Pulse 75  Wt 157 lb 8 oz (71.442 kg)  LMP 11/22/2011   Constitutional: The patient looks tired today. Her affect is normal, but she is not the perky and upbeat person she was in the past. She has gained 3 pounds since last visit, equivalent to an excess 95 calories per day. She has not had to order larger clothes. . Face: She has darker coloration of the infraorbital areas, but normal coloration of most of her face.. Eyes: There is no arcus or proptosis.  Mouth: The oropharynx appears normal. The tongue appears normal. There is normal oral  moisture. There is no obvious gingivitis. Neck: There are no bruits present. Her strap muscles are smaller, c/w less upper body exercise. The thyroid gland appears more enlarged in size. The thyroid gland is larger at about 24+ grams in size. The right lobe is mildly enlarged. The left lobe is larger and firmer. The  midportion of the left lower pole is firmer in consistency today. There is no thyroid tenderness to palpation. Lungs: The lungs are clear. Air movement is good. Heart: The heart rhythm and rate appear normal. Heart sounds S1 and S2 are normal. I do not appreciate any pathologic heart murmurs. Abdomen: The abdomen is slightly enlarged. Bowel sounds are normal. The abdomen is soft and non-tender. There is no obviously palpable hepatomegaly, splenomegaly, or other masses.  Arms: Muscle mass appears appropriate for age.  Hands: There is no obvious tremor. Phalangeal and metacarpophalangeal joints appear normal. Palms are normal. Legs: Muscle mass appears appropriate for age. There is no edema.   Neurologic: Muscle strength is normal for age and gender  in both the upper and the lower extremities. Muscle tone appears normal. Sensation to touch is normal in the legs.   Labs:  04/11/15: HbA1c 5.6%  12/12/14: CBC normal; CMP normal; iron 83; c-peptide 6.28 (0.80-3.90)  10/14/14: TSH 1.925, free T4 0.99, free T3 2.8  09/30/14: HbA1c was 6.1%.   02/08/14: CMP normal; TSH 1.80, free T4 0.65, free T3 2.3; Hgb 12.0, Hct 36; cholesterol 171, triglycerides 59, HDL 688, LDL 90;   08/18/13: Normal TFTs (TSH 2.646, free T4 0.88, free T3 2.6)  06/30/12: Normal TFTs (TSH 1.508, free T4 1.01, free T3 2.7), normal CMP  07/06/11: Normal TFTs (TSH 2.674, free T4 1.23, free T3 2.4)  ASSESSMENT: 1. Goiter: The thyroid gland is a bit larger and firmer today, c/w a relatively recent flare up of thyroiditis. From December 2014 to June 2015 all three TFTs decreased in parallel. From June 2015 to February 2016 all three TFTs increased in parallel. These shifts, in which all three of the TFTs shift upward or downward in the same direction, are pathognomonic for interim flare ups of Hashimoto's disease.  2. Abnormal TFTs/transient Hypothyroidism: She was mid-range euthyroid in February. She did not need Synthroid treatment  at that time, but she might need it now. . 3. Thyroiditis: Her Hashimoto's disease is clinically quiescent. However, the waxing and waning of thyroid gland size and the rather wide fluctuations in TFTs over time all indicate intermittent thyroiditis. 4. Hypertension: Her DBP is higher today on her current dose of losartan.  Her BP in June was 122/68. She is feeling very stressed today.  5. Prediabetes: Her HbA1c has normalized on metformin, but she is in the 5% of people who do not tolerate metformin. Therefore we should discontinue the metformin. She will need to work harder on eating right and exercising.  6. Fatigue, other:  She was euthyroid in February. Since then she has become more tired. She may now be hypothyroid.   7. Dyspepsia: her dyspepsia is worse. She may benefit from ranitidine twice daily.   PLAN: 1. Diagnostic: TFTs now and in 4 months 2. Therapeutic: No Synthroid medication at this time. Continue losartan. Discontinue Start metformin.  Start ranitidine, 150 mg, twice daily. Check BP at her clinic and schedule FU with Dr. Larose Kells. 3. Patient education: We discussed how Hashimoto's Dz will probably make her hypothyroid at some time in the future. 4. Follow-Up: FU appointment in 4 months with me.  Level of Service: This visit lasted in excess of 50 minutes. More than 50% of the visit was devoted to counseling.  Sherrlyn Hock

## 2015-04-25 ENCOUNTER — Encounter: Payer: Self-pay | Admitting: *Deleted

## 2015-07-17 ENCOUNTER — Other Ambulatory Visit: Payer: Self-pay | Admitting: "Endocrinology

## 2015-08-11 ENCOUNTER — Other Ambulatory Visit: Payer: Self-pay

## 2015-08-15 ENCOUNTER — Ambulatory Visit: Payer: BLUE CROSS/BLUE SHIELD | Admitting: Internal Medicine

## 2015-08-16 ENCOUNTER — Ambulatory Visit: Payer: BLUE CROSS/BLUE SHIELD | Admitting: Internal Medicine

## 2015-08-17 ENCOUNTER — Encounter: Payer: Self-pay | Admitting: "Endocrinology

## 2015-08-17 ENCOUNTER — Ambulatory Visit (INDEPENDENT_AMBULATORY_CARE_PROVIDER_SITE_OTHER): Payer: BLUE CROSS/BLUE SHIELD | Admitting: "Endocrinology

## 2015-08-17 VITALS — BP 119/82 | HR 70 | Wt 157.0 lb

## 2015-08-17 DIAGNOSIS — R5383 Other fatigue: Secondary | ICD-10-CM

## 2015-08-17 DIAGNOSIS — E049 Nontoxic goiter, unspecified: Secondary | ICD-10-CM

## 2015-08-17 DIAGNOSIS — R7303 Prediabetes: Secondary | ICD-10-CM

## 2015-08-17 DIAGNOSIS — R1013 Epigastric pain: Secondary | ICD-10-CM

## 2015-08-17 DIAGNOSIS — I10 Essential (primary) hypertension: Secondary | ICD-10-CM

## 2015-08-17 DIAGNOSIS — R7989 Other specified abnormal findings of blood chemistry: Secondary | ICD-10-CM

## 2015-08-17 DIAGNOSIS — E063 Autoimmune thyroiditis: Secondary | ICD-10-CM | POA: Diagnosis not present

## 2015-08-17 DIAGNOSIS — R946 Abnormal results of thyroid function studies: Secondary | ICD-10-CM

## 2015-08-17 LAB — HEMOGLOBIN A1C
HEMOGLOBIN A1C: 6.2 % — AB (ref <5.7)
Mean Plasma Glucose: 131 mg/dL — ABNORMAL HIGH (ref <117)

## 2015-08-17 LAB — T3, FREE: T3, Free: 2.4 pg/mL (ref 2.3–4.2)

## 2015-08-17 LAB — T4, FREE: FREE T4: 0.86 ng/dL (ref 0.80–1.80)

## 2015-08-17 LAB — TSH: TSH: 1.847 u[IU]/mL (ref 0.350–4.500)

## 2015-08-17 NOTE — Patient Instructions (Signed)
Follow up visit in 6 months. Please repeat the blood tests one week prior to the next visit.

## 2015-08-17 NOTE — Progress Notes (Signed)
CC: FU thyroiditis, goiter, transient hyperthyroidism due to flare ups of thyroiditis, hypertension  HPI: Wanda Herman is a 44 y.o. African-American woman. She was unaccompanied.  1. I have been following the patient since 01/04/05 for the above issues. In the subsequent 10 years I've seen Wanda Herman in clinic about once every 6 months. She has had multiple flare-ups of Hashimoto's Thyroiditis over the years that have resulted in her being hyperthyroid (Hashitoxicosis), hypothyroid, and euthyroid at different times in between. Her TSH values have varied from 0.098-4.122. Free T4 values have varied from 0.60-1.23. Free T3 values have varied from 2.2-3.3. TPO antibody levels have always been elevated between  52.2-113.3. Fortunately, she has been euthyroid most of the time, so I have never treated her with Synthroid.   2. Her last [facility] visit was on 04/11/15. At that visit we stopped her metformin and started ranitidine, 150 mg, twice daily. Her stomach feels much better and she has less belly hunger. In the interim she has remained healthy, except for occasional seasonal allergy symptoms. She has been exercising 3-5 times per week for 30 minutes each time. She still uses Restasis for her dry eyes and takes losartan for BP control.   3.Pertinent Review of Systems: Constitutional: She feels better. Her energy levels are better. She has been on vacation for more than one week, had a massage yesterday, and feels much more relaxed. She is sleeping "okay". She sometimes tosses and turns a lot during the night. Some mornings she does not feel as if she had enough rest. She does not use caffeine. She also feels very hot intermittently.   Eyes: Her vision has been good since her lasik surgery four years ago. There are no significant eye complaints. Neck: The patient has no complaints of anterior neck swelling, soreness, tenderness,  pressure, discomfort, or difficulty swallowing.  Heart: Heart rate increases with  exercise or other physical activity. The patient has no complaints of palpitations, irregular heat beats, chest pain, or chest pressure. Gastrointestinal: As above. Bowel movements have normalized since stopping metformin. She has no other GI complaints. Legs: Muscle mass and strength seem normal. There are no complaints of numbness, tingling, burning, or pain. No edema is noted. Feet: There are no obvious foot problems. There are no complaints of numbness, tingling, burning, or pain. No edema is noted. GYN: She had a partial hysterectomy in 2013, but still has both ovaries. She still has PMS symptoms in some months.  Skin: She feels the darker coloration of her lateral face is markedly reduced.   PAST MEDICAL, FAMILY, AND SOCIAL HISTORY: 1. 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 [name] 's mother had menopause in her early 82s, but had premenopausal symptoms beginning in her 22s. 2. Activities: She has not been exercising as often recently. She's not a "gym person".   3. Tobacco, alcohol, and illicit drugs: Never 4. PCP: Dr. Larose Kells  REVIEW OF SYSTEMS: Ms. Kamada has no other significant complaints involving any of her other body systems.  PHYSICAL EXAM: BP 119/82 mmHg  Pulse 70  Wt 157 lb (71.215 kg)  LMP 11/22/2011 She has lost 8 oz.  Constitutional: The patient looks healthy, rested, vibrant, and gorgeous today. Her affect is normal. She is bright and perky today.   Face: She has darker coloration of the infraorbital areas, but normal coloration of most of her face.. Eyes: There is no arcus or proptosis.  Mouth:  The oropharynx appears normal. The tongue appears normal. There is normal oral moisture. There is no obvious gingivitis. Neck: There are no bruits present. Her strap muscles are smaller, c/w less upper body exercise. The thyroid gland appears mildly enlarged. The thyroid gland is smaller at about 22+ grams in  size. The right lobe is within normal limits for size today. The left lobe is enlarged, but less so than at her last visit. The midportion of the left lower pole is firmer in consistency again today. There is no thyroid tenderness to palpation. She has 1+ acanthosis nigricans.  Lungs: The lungs are clear. Air movement is good. Heart: The heart rhythm and rate appear normal. Heart sounds S1 and S2 are normal. I do not appreciate any pathologic heart murmurs. Abdomen: The abdomen is mildly enlarged. Bowel sounds are normal. The abdomen is soft and non-tender. There is no obviously palpable hepatomegaly, splenomegaly, or other masses.  Arms: Muscle mass appears appropriate for age.  Hands: There is no obvious tremor. Phalangeal and metacarpophalangeal joints appear normal. Palms are normal. Legs: Muscle mass appears appropriate for age. There is no edema.   Neurologic: Muscle strength is normal for age and gender  in both the upper and the lower extremities. Muscle tone appears normal. Sensation to touch is normal in the legs.   Labs:  04/11/15: HbA1c 5.6%  12/12/14: CBC normal; CMP normal; iron 83; C-peptide 6.28 (0.80-3.90)  10/14/14: TSH 1.925, free T4 0.99, free T3 2.8  09/30/14: HbA1c was 6.1%.   02/08/14: CMP normal; TSH 1.80, free T4 0.65, free T3 2.3; Hgb 12.0, Hct 36; cholesterol 171, triglycerides 59, HDL 688, LDL 90;   08/18/13: Normal TFTs (TSH 2.646, free T4 0.88, free T3 2.6)  06/30/12: Normal TFTs (TSH 1.508, free T4 1.01, free T3 2.7), normal CMP  07/06/11: Normal TFTs (TSH 2.674, free T4 1.23, free T3 2.4)  ASSESSMENT: 1. Goiter: The thyroid gland is a bit smaller and more normal in consistency today, c/w less thyroiditis recently. From December 2014 to June 2015 all three TFTs decreased in parallel. From June 2015 to February 2016 all three TFTs increased in parallel. These shifts, in which all three of the TFTs shift upward or downward in the same direction, are pathognomonic  for interim flare ups of Hashimoto's disease.  2. Abnormal TFTs/transient Hypothyroidism: She was mid-range euthyroid in February. She did not need Synthroid treatment at that time, but she might need it now. . 3. Thyroiditis: Her Hashimoto's disease is clinically quiescent. However, the waxing and waning of thyroid gland size and the rather wide fluctuations in TFTs over time all indicate intermittent thyroiditis. 4. Hypertension: Her SBP and DBP are both better today. I would like to see her increase her cardio exercise to 45-60 minutes 3-4 times per week.  5. Prediabetes: Her HbA1c had normalized on metformin, but she was in the 5% of people who did not tolerate metformin. We will repeat her HbA1c now. She will need to work harder on eating right and exercising.  6. Fatigue, other:  She was euthyroid in February. Since then she had become more tired,but is doing much better after one week of vacation.    7. Dyspepsia: Her dyspepsia is better. She is doing better with ranitidine twice daily.   PLAN: 1. Diagnostic: TFTs and HbA1c today. Repeat TFTS in 6 months.  2. Therapeutic: No Synthroid medication at this time. Continue losartan. Continue ranitidine, 150 mg, twice daily. Check BP at her clinic and schedule FU  with Dr. Larose Kells. 3. Patient education: We discussed how Hashimoto's Dz will probably make her hypothyroid at some time in the future. 4. Follow-Up: FU appointment in 6 months with me.   Level of Service: This visit lasted in excess of 50 minutes. More than 50% of the visit was devoted to counseling.  Sherrlyn Hock, MD, CDE

## 2015-08-18 ENCOUNTER — Ambulatory Visit (INDEPENDENT_AMBULATORY_CARE_PROVIDER_SITE_OTHER): Payer: BLUE CROSS/BLUE SHIELD | Admitting: Internal Medicine

## 2015-08-18 ENCOUNTER — Encounter: Payer: Self-pay | Admitting: Internal Medicine

## 2015-08-18 VITALS — BP 118/64 | HR 66 | Temp 98.0°F | Ht 64.0 in | Wt 158.0 lb

## 2015-08-18 DIAGNOSIS — E0580 Other thyrotoxicosis without thyrotoxic crisis or storm: Secondary | ICD-10-CM | POA: Diagnosis not present

## 2015-08-18 DIAGNOSIS — R7303 Prediabetes: Secondary | ICD-10-CM | POA: Diagnosis not present

## 2015-08-18 DIAGNOSIS — I10 Essential (primary) hypertension: Secondary | ICD-10-CM | POA: Diagnosis not present

## 2015-08-18 DIAGNOSIS — E063 Autoimmune thyroiditis: Secondary | ICD-10-CM | POA: Diagnosis not present

## 2015-08-18 NOTE — Progress Notes (Signed)
Pre visit review using our clinic review tool, if applicable. No additional management support is needed unless otherwise documented below in the visit note. 

## 2015-08-18 NOTE — Progress Notes (Signed)
Subjective:    Patient ID: Wanda Herman, female    DOB: [birth year], 44 y.o.   MRN: [id number]  DOS:  08/18/2015 Type of visit - description : routine visit Interval history: Prediabetes: Self discontinue metformin due to GI side effects. No ambulatory CBGs Thyroid disease: Recently saw Dr. Tobe Sos, doing well.    Review of Systems Doing great with diet and exercise. Goes to the gym 4 times a week. No chest pain, difficulty breathing. No anxiety, depression  Past Medical History  Diagnosis Date  . Thyroiditis, autoimmune   . Goiter   . Hypertension   . Gestational diabetes 2005  . Headache(784.0)   . H/O candidiasis   . H/O varicella   . Breast mass, right 05/30/04  . Breast mass, left 07/08/06  . Fibroid 08/21/2009  . Prediabetes 09/30/2014    Past Surgical History  Procedure Laterality Date  . Lasik    . Abdominal hysterectomy  12/04/2011    Procedure: HYSTERECTOMY ABDOMINAL;  Surgeon: Delice Lesch, MD;  Location: [facility];  Service: Gynecology;  Laterality: N/A;  . Cystoscopy  12/04/2011    Procedure: CYSTOSCOPY;  Surgeon: Delice Lesch, MD;  Location: [facility];  Service: Gynecology;  Laterality: N/A;    Social History   Social History  . Marital Status: Married    Spouse Name: N/A  . Number of Children: 2  . Years of Education: N/A   Occupational History  . accountant    Social History Main Topics  . Smoking status: Never Smoker   . Smokeless tobacco: Never Used  . Alcohol Use: No  . Drug Use: No  . Sexual Activity: Yes    Birth Control/ Protection: Pill     Comment: tri-sprinted   Other Topics Concern  . Not on file   Social History Narrative   Lives w/ husband and two children 18 and 71 y/o         Medication List       This list is accurate as of: 08/18/15 11:59 PM.  Always use your most recent med list.               cycloSPORINE 0.05 % ophthalmic emulsion  Commonly known as:  RESTASIS  1 drop 2 (two) times daily.     losartan 25 MG tablet  Commonly known as:  COZAAR  TAKE ONE AND ONE-HALF OF THE (25 MG TABLETS) BY MOUTH ONCE DAILY     ranitidine 150 MG tablet  Commonly known as:  ZANTAC  Take 1 tablet (150 mg total) by mouth 2 (two) times daily.           Objective:   Physical Exam BP 118/64 mmHg  Pulse 66  Temp(Src) 98 F (36.7 C) (Oral)  Ht 5\' 4"  (1.626 m)  Wt 158 lb (71.668 kg)  BMI 27.11 kg/m2  SpO2 99%  LMP 11/22/2011 General:   Well developed, well nourished . NAD.  HEENT:  Normocephalic . Face symmetric, atraumatic Lungs:  CTA B Normal respiratory effort, no intercostal retractions, no accessory muscle use. Heart: RRR,  no murmur.  No pretibial edema bilaterally  Skin: Not pale. Not jaundice Neurologic:  alert & oriented X3.  Speech normal, gait appropriate for age and unassisted Psych--  Cognition and judgment appear intact.  Cooperative with normal attention span and concentration.  Behavior appropriate. No anxious or depressed appearing.      Assessment & Plan:   Assessment Prediabetes -- metformin intolerant, gi sx. Self d/c  2016 HTN Thyroid: h/o autoimmune thyroiditis, goiter, Dr. Tobe Sos  Migraines  Dyspepsia -- on zantac, Align Dry eyes-- sees eye doctor x 2/ year   PLAN: Prediabetes: last A1c 6.2 without taking metformin. Recommend to continue with her lifestyle and reassess in 6 months. HTN: BP well-controlled, rec monitoring in the ambulatory setting.BMP on return to the office thyroid disease: TFTs normal. Follow-up by endocrinology RTC 6 months, CPX

## 2015-08-18 NOTE — Patient Instructions (Signed)
BEFORE YOU LEAVE THE OFFICE:  GO TO THE FRONT DESK Schedule a complete physical exam to be done in 6 months  Please be fasting    AFTER YOU LEAVE THE OFFICE:  Check the  blood pressure 2 or 3 times a month   Be sure your blood pressure is between 110/65 and  145/85. If it is consistently higher or lower, let me know

## 2015-08-20 ENCOUNTER — Other Ambulatory Visit: Payer: Self-pay | Admitting: "Endocrinology

## 2015-08-28 ENCOUNTER — Encounter: Payer: Self-pay | Admitting: *Deleted

## 2015-11-03 ENCOUNTER — Other Ambulatory Visit: Payer: Self-pay

## 2015-11-03 MED ORDER — LOSARTAN POTASSIUM 25 MG PO TABS
37.5000 mg | ORAL_TABLET | Freq: Every day | ORAL | Status: DC
Start: 2015-11-03 — End: 2016-02-12

## 2015-12-03 ENCOUNTER — Other Ambulatory Visit: Payer: Self-pay | Admitting: "Endocrinology

## 2016-01-08 ENCOUNTER — Other Ambulatory Visit: Payer: Self-pay | Admitting: "Endocrinology

## 2016-01-16 DIAGNOSIS — H5213 Myopia, bilateral: Secondary | ICD-10-CM | POA: Diagnosis not present

## 2016-01-16 DIAGNOSIS — H52221 Regular astigmatism, right eye: Secondary | ICD-10-CM | POA: Diagnosis not present

## 2016-01-16 DIAGNOSIS — H524 Presbyopia: Secondary | ICD-10-CM | POA: Diagnosis not present

## 2016-02-08 ENCOUNTER — Other Ambulatory Visit: Payer: Self-pay | Admitting: "Endocrinology

## 2016-02-12 ENCOUNTER — Ambulatory Visit (INDEPENDENT_AMBULATORY_CARE_PROVIDER_SITE_OTHER): Payer: BLUE CROSS/BLUE SHIELD | Admitting: Internal Medicine

## 2016-02-12 ENCOUNTER — Encounter: Payer: Self-pay | Admitting: Internal Medicine

## 2016-02-12 ENCOUNTER — Encounter: Payer: Self-pay | Admitting: "Endocrinology

## 2016-02-12 ENCOUNTER — Ambulatory Visit (INDEPENDENT_AMBULATORY_CARE_PROVIDER_SITE_OTHER): Payer: BLUE CROSS/BLUE SHIELD | Admitting: "Endocrinology

## 2016-02-12 ENCOUNTER — Encounter: Payer: Self-pay | Admitting: *Deleted

## 2016-02-12 VITALS — BP 126/74 | HR 65 | Temp 98.2°F | Ht 64.0 in | Wt 159.5 lb

## 2016-02-12 VITALS — BP 130/86 | HR 68 | Wt 161.0 lb

## 2016-02-12 DIAGNOSIS — Z0000 Encounter for general adult medical examination without abnormal findings: Secondary | ICD-10-CM

## 2016-02-12 DIAGNOSIS — E049 Nontoxic goiter, unspecified: Secondary | ICD-10-CM

## 2016-02-12 DIAGNOSIS — E7800 Pure hypercholesterolemia, unspecified: Secondary | ICD-10-CM

## 2016-02-12 DIAGNOSIS — R7303 Prediabetes: Secondary | ICD-10-CM

## 2016-02-12 DIAGNOSIS — I10 Essential (primary) hypertension: Secondary | ICD-10-CM

## 2016-02-12 DIAGNOSIS — E663 Overweight: Secondary | ICD-10-CM | POA: Diagnosis not present

## 2016-02-12 DIAGNOSIS — R5383 Other fatigue: Secondary | ICD-10-CM

## 2016-02-12 DIAGNOSIS — Z09 Encounter for follow-up examination after completed treatment for conditions other than malignant neoplasm: Secondary | ICD-10-CM | POA: Insufficient documentation

## 2016-02-12 DIAGNOSIS — E063 Autoimmune thyroiditis: Secondary | ICD-10-CM

## 2016-02-12 LAB — CBC WITH DIFFERENTIAL/PLATELET
Basophils Absolute: 0 10*3/uL (ref 0.0–0.1)
Basophils Relative: 1 % (ref 0.0–3.0)
EOS PCT: 2.1 % (ref 0.0–5.0)
Eosinophils Absolute: 0.1 10*3/uL (ref 0.0–0.7)
HEMATOCRIT: 40.4 % (ref 36.0–46.0)
Hemoglobin: 13.5 g/dL (ref 12.0–15.0)
LYMPHS ABS: 2.1 10*3/uL (ref 0.7–4.0)
LYMPHS PCT: 43.2 % (ref 12.0–46.0)
MCHC: 33.3 g/dL (ref 30.0–36.0)
MCV: 91.3 fl (ref 78.0–100.0)
MONOS PCT: 7.8 % (ref 3.0–12.0)
Monocytes Absolute: 0.4 10*3/uL (ref 0.1–1.0)
NEUTROS ABS: 2.2 10*3/uL (ref 1.4–7.7)
NEUTROS PCT: 45.9 % (ref 43.0–77.0)
PLATELETS: 305 10*3/uL (ref 150.0–400.0)
RBC: 4.42 Mil/uL (ref 3.87–5.11)
RDW: 13 % (ref 11.5–15.5)
WBC: 4.9 10*3/uL (ref 4.0–10.5)

## 2016-02-12 LAB — BASIC METABOLIC PANEL
BUN: 13 mg/dL (ref 6–23)
CHLORIDE: 102 meq/L (ref 96–112)
CO2: 29 meq/L (ref 19–32)
Calcium: 9.5 mg/dL (ref 8.4–10.5)
Creatinine, Ser: 0.82 mg/dL (ref 0.40–1.20)
GFR: 96.75 mL/min (ref >60.00)
GLUCOSE: 102 mg/dL — AB (ref 70–99)
POTASSIUM: 4.1 meq/L (ref 3.5–5.1)
SODIUM: 138 meq/L (ref 135–145)

## 2016-02-12 LAB — LIPID PANEL
CHOL/HDL RATIO: 3
Cholesterol: 210 mg/dL — ABNORMAL HIGH (ref 0–200)
HDL: 66 mg/dL (ref >39.00)
LDL Cholesterol: 127 mg/dL — ABNORMAL HIGH (ref 0–99)
NONHDL: 144.32
Triglycerides: 89 mg/dL (ref 0.0–149.0)
VLDL: 17.8 mg/dL (ref 0.0–40.0)

## 2016-02-12 LAB — ALT: ALT: 15 U/L (ref 0–35)

## 2016-02-12 LAB — HEMOGLOBIN A1C: HEMOGLOBIN A1C: 5.9 % (ref 4.6–6.5)

## 2016-02-12 LAB — AST: AST: 20 U/L (ref 0–37)

## 2016-02-12 NOTE — Assessment & Plan Note (Signed)
Prediabetes: Recheck in a A1c, doing well with lifestyle, she is willing to try metformin again, previously intolerant to 500 mg, we could try the ER formulation or  consider Januvia. HTN: On low-dose losartan, BP okay. No change Thyroid disease: To see endocrinology today ? Reactive airway disease: Patient to call for a inhaler if symptoms resurface. RTC one year

## 2016-02-12 NOTE — Progress Notes (Signed)
Pre visit review using our clinic review tool, if applicable. No additional management support is needed unless otherwise documented below in the visit note. 

## 2016-02-12 NOTE — Patient Instructions (Signed)
GO TO THE LAB : Get the blood work     GO TO THE FRONT DESK Schedule your next appointment for a   Physical in 1 year   Check the  blood pressure 2 or 3 times a month   Be sure your blood pressure is between 110/65 and  135/85. If it is consistently higher or lower, let me know    

## 2016-02-12 NOTE — Patient Instructions (Signed)
Follow up visit in 6 months. Please repeat lab tests 2 weeks prior to next visit.

## 2016-02-12 NOTE — Progress Notes (Signed)
Subjective:    Patient ID: Wanda Herman, female    DOB: 1970/12/28, 45 y.o.   MRN: [id number]  DOS:  02/12/2016 Type of visit - description : CPX Interval history: No major concerns, doing well with diet and she remains active, takes a daily 20-30 minute-walk   Review of Systems Constitutional: No fever. No chills. No unexplained wt changes. No unusual sweats  HEENT: No dental problems, no ear discharge, no facial swelling, no voice changes. No eye discharge, no eye  redness , no  intolerance to light   Respiratory: Question of exercise-induced asthma before, currently not an issue although she feels slightly short of breath on the hottest days of  the summer  Cardiovascular: No CP, no leg swelling , no  Palpitations  GI: no nausea, no vomiting, no diarrhea , no  abdominal pain.  No blood in the stools. No dysphagia, no odynophagia    Endocrine: No polyphagia, no polyuria , no polydipsia  GU: No dysuria, gross hematuria, difficulty urinating. No urinary urgency, no frequency.  Musculoskeletal: No joint swellings or unusual aches or pains  Skin: No change in the color of the skin, palor , no  Rash  Allergic, immunologic: No environmental allergies , no  food allergies  Neurological: No dizziness no  syncope. No headaches. No diplopia, no slurred, no slurred speech, no motor deficits, no facial  Numbness  Hematological: No enlarged lymph nodes, no easy bruising , no unusual bleedings  Psychiatry: No suicidal ideas, no hallucinations, no beavior problems, no confusion.  No unusual/severe anxiety, no depression   Past Medical History  Diagnosis Date  . Thyroiditis, autoimmune   . Goiter   . Hypertension   . Gestational diabetes 2005  . Headache(784.0)   . H/O candidiasis   . H/O varicella   . Breast mass, right 05/30/04  . Breast mass, left 07/08/06  . Fibroid 08/21/2009  . Prediabetes 09/30/2014    Past Surgical History  Procedure Laterality Date  . Lasik      . Abdominal hysterectomy  12/04/2011    Procedure: HYSTERECTOMY ABDOMINAL;  Surgeon: Delice Lesch, MD;  Location: [facility];  Service: Gynecology;  Laterality: N/A;  . Cystoscopy  12/04/2011    Procedure: CYSTOSCOPY;  Surgeon: Delice Lesch, MD;  Location: [facility];  Service: Gynecology;  Laterality: N/A;    Social History   Social History  . Marital Status: Married    Spouse Name: N/A  . Number of Children: 2  . Years of Education: N/A   Occupational History  . accountant    Social History Main Topics  . Smoking status: Never Smoker   . Smokeless tobacco: Never Used  . Alcohol Use: No  . Drug Use: No  . Sexual Activity: Yes    Birth Control/ Protection: Pill     Comment: tri-sprinted   Other Topics Concern  . Not on file   Social History Narrative   Lives w/ husband and two children : female 1999, 2005      Family History  Problem Relation Age of Onset  . Diabetes Father   . Colon cancer Neg Hx   . Breast cancer Neg Hx   . Heart attack Other     GF at 45 y/o  . Diabetes Maternal Grandmother   . Hypertension Maternal Grandmother   . Ovarian cancer Maternal Aunt     ovarian cancer  . Diabetes Mother   . Hypertension Mother   . Hyperlipidemia Mother  Medication List       This list is accurate as of: 02/12/16  1:34 PM.  Always use your most recent med list.               cycloSPORINE 0.05 % ophthalmic emulsion  Commonly known as:  RESTASIS  1 drop 2 (two) times daily.     losartan 25 MG tablet  Commonly known as:  COZAAR  Take 25 mg by mouth daily.     ranitidine 150 MG tablet  Commonly known as:  ZANTAC  TAKE ONE TABLET BY MOUTH TWICE DAILY           Objective:   Physical Exam BP 126/74 mmHg  Pulse 65  Temp(Src) 98.2 F (36.8 C) (Oral)  Ht 5\' 4"  (1.626 m)  Wt 159 lb 8 oz (72.349 kg)  BMI 27.36 kg/m2  SpO2 97%  LMP 11/22/2011  General:   Well developed, well nourished . NAD.  Neck: Mild not tender  thyromegaly  HEENT:   Normocephalic . Face symmetric, atraumatic Lungs:  CTA B Normal respiratory effort, no intercostal retractions, no accessory muscle use. Heart: RRR,  no murmur.  No pretibial edema bilaterally  Abdomen:  Not distended, soft, non-tender. No rebound or rigidity.   Skin: Exposed areas without rash. Not pale. Not jaundice Neurologic:  alert & oriented X3.  Speech normal, gait appropriate for age and unassisted Strength symmetric and appropriate for age.  Psych: Cognition and judgment appear intact.  Cooperative with normal attention span and concentration.  Behavior appropriate. No anxious or depressed appearing.    Assessment & Plan:   Assessment Prediabetes -- metformin intolerant, gi sx. Self d/c 2016 HTN Reactive airway dz? Never rx a inhaler as off 01-2016 Thyroid: h/o autoimmune thyroiditis, goiter, Dr. Tobe Sos  Migraines  Dyspepsia -- on zantac Dry eyes-- sees eye doctor x 2/ year   PLAN: Prediabetes: Recheck in a A1c, doing well with lifestyle, she is willing to try metformin again, previously intolerant to 500 mg, we could try the ER formulation or  consider Januvia. HTN: On low-dose losartan, BP okay. No change Thyroid disease: To see endocrinology today ? Reactive airway disease: Patient to call for a inhaler if symptoms resurface. RTC one year

## 2016-02-12 NOTE — Progress Notes (Signed)
CC: FU thyroiditis, goiter, transient hyperthyroidism due to flare ups of thyroiditis, hypertension, and prediabetes  HPI: Wanda Herman is a 45 y.o. African-American woman. She was unaccompanied.  1. I have been following the patient since 01/04/05 for the above issues. In the subsequent 10 years I've seen Wanda Herman in clinic about once every 6 months. She has had multiple flare-ups of Hashimoto's Thyroiditis over the years that have resulted in her being hyperthyroid (Hashitoxicosis), hypothyroid, and euthyroid at different times in between. Her TSH values have varied from 0.098-4.122. Free T4 values have varied from 0.60-1.23. Free T3 values have varied from 2.2-3.3. TPO antibody levels have always been elevated between  52.2-113.3. Fortunately, she has been euthyroid most of the time, so I have never treated her with Synthroid.   2. Her last [facility] visit was on 08/17/15. In the interim she has ben healthy, except for occasional allergy symptoms. . She has not been unusually tired Her stomach feels good and she has less belly hunger. She has been exercising 3-5 times per week for 30 minutes each time. She still uses Restasis for her dry eyes and takes ranitidine, twice daily to reduce stomach acid and losartan for BP control.   3.Pertinent Review of Systems: Constitutional: She feels "pretty good". Her energy levels are better. She was been on vacation for one week, but then went back to work last week. She is sleeping "okay". She sometimes tosses and turns, but not as much. She still occasionally feels that she has not had enough restful sleep. She does not use caffeine. Her body temperature seems normal to her.    Eyes: Her vision has been good since her lasik surgery four years ago. There are no significant eye complaints. Neck: The patient has no complaints of anterior neck swelling, soreness, tenderness,  pressure, discomfort, or difficulty swallowing.  Heart: Heart rate increases with exercise  or other physical activity. The patient has no complaints of palpitations, irregular heat beats, chest pain, or chest pressure. Gastrointestinal: As above. Bowel movements have normalized since stopping metformin. She has no other GI complaints. Legs: Muscle mass and strength seem normal. There are no complaints of numbness, tingling, burning, or pain. No edema is noted. Feet: There are no obvious foot problems. There are no complaints of numbness, tingling, burning, or pain. No edema is noted. GYN: She had a partial hysterectomy in 2013, but still has both ovaries. She still has PMS symptoms in some months.  Skin: She feels that the darker coloration of her lateral face has continued to lighten.   PAST MEDICAL, FAMILY, AND SOCIAL HISTORY: 1. 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 [name] mother had menopause in her early 25s, but had premenopausal symptoms beginning in her 27s. 2. Activities: She has been exercising more often at home and at work.   3. Tobacco, alcohol, and illicit drugs: Never 4. PCP: Dr. Larose Kells  REVIEW OF SYSTEMS: Wanda Herman has no other significant complaints involving any of her other body systems.  PHYSICAL EXAM: BP 130/86 mmHg  Pulse 68  Wt 161 lb (73.029 kg)  LMP 11/22/2011 She has gained 4 pounds.  Constitutional: The patient looks healthy, but a bit heavier. Her affect and insight are normal. She is bright and fairly upbeat today.    Face: The dark coloration of the infraorbital areas and areas lateral to the orbits is less evident. She has normal coloration of most of  her face.. Eyes: There is no arcus or proptosis.  Mouth: The oropharynx appears normal. The tongue appears normal. There is normal oral moisture. There is no obvious gingivitis. Neck: There are no bruits present. Her strap muscles are smaller, c/w less upper body exercise. The thyroid gland appears mildly enlarged. The thyroid  gland is a bit smaller at about 22 grams in size. The right lobe is very slightly enlarged today, the left lobe is larger than the right, but smaller than at her last visit.  There is no thyroid tenderness to palpation. She has 1+ acanthosis nigricans.  Lungs: The lungs are clear. Air movement is good. Heart: The heart rhythm and rate appear normal. Heart sounds S1 and S2 are normal. I do not appreciate any pathologic heart murmurs. Abdomen: The abdomen is more enlarged. Bowel sounds are normal. The abdomen is soft and non-tender. There is no obviously palpable hepatomegaly, splenomegaly, or other masses.  Arms: Muscle mass appears appropriate for age.  Hands: There is no obvious tremor. Phalangeal and metacarpophalangeal joints appear normal. Palms are normal. Legs: Muscle mass appears appropriate for age. There is no edema.   Neurologic: Muscle strength is normal for age and gender  in both the upper and the lower extremities. Muscle tone appears normal. Sensation to touch is normal in the legs.   Labs:  Labs 02/12/16: BMP normal, except fasting glucose 102; CBC normal; cholesterol 210, triglycerides 89, HDL 66, LDL 127; AST 20, ALT 15; HbA1c 5.9%  Labs 02/07/16: HbA1c 5.7%; TSH 2.08, free T4 1.0, free T3 178 (ref. 210-440), total T3 90 (ref. 78-181)  Labs 08/17/15: HbA1c 6.2%; TSH 1.847, free T4 0.86, free T3 2.4;   04/11/15: HbA1c 5.6%  12/12/14: CBC normal; CMP normal; iron 83; C-peptide 6.28 (0.80-3.90)  10/14/14: TSH 1.925, free T4 0.99, free T3 2.8  09/30/14: HbA1c was 6.1%.   02/08/14: CMP normal; TSH 1.80, free T4 0.65, free T3 2.3; Hgb 12.0, Hct 36; cholesterol 171, triglycerides 59, HDL 688, LDL 90;   08/18/13: Normal TFTs (TSH 2.646, free T4 0.88, free T3 2.6)  06/30/12: Normal TFTs (TSH 1.508, free T4 1.01, free T3 2.7), normal CMP  07/06/11: Normal TFTs (TSH 2.674, free T4 1.23, free T3 2.4)  ASSESSMENT: 1-2. Goiter/thyroiditis:   A. The thyroid gland is a bit smaller and  more normal in consistency today, c/w less thyroiditis recently.  B. Her Hashimoto's disease is clinically quiescent today. However, the waxing and waning of thyroid gland size is c/w evolving Hashimoto's thyroiditis. The wide  degree of fluctuations of her TFTs is also c/w evolving thyroiditis.   C. From December 2014 to June 2015 all three TFTs decreased in parallel. From June 2015 to February 2016 all three TFTs increased in parallel. These shifts, in which all three of the TFTs shift upward or downward in the same direction, are pathognomonic for interim flare ups of Hashimoto's disease.  3. Abnormal TFTs/transient hypothyroidism:   A. She was mid-range euthyroid in February and December 2016.  B. She is still euthyroid this month, but taken together her TFTs are at about the 30% of the normal thyroid range. She does not need Synthroid treatment at this time, but she might need it in the future.  4. Hypertension: Her SBP and DBP are higher today, c/w her weight gain.  I would like to see her increase her cardio exercise to 45-60 minutes 3-4 times per week.  5. Prediabetes: Her HbA1c had normalized on metformin, but she was in  the 5% of people who did not tolerate metformin. Her A1c was higher in December, but a bit lower now. She will need to work harder on eating right and exercising.  6. Fatigue, other:  She has remained euthyroid. I suspect that when she is stressed she does not sleep well.     7. Dyspepsia: Her dyspepsia is better. She is doing better with ranitidine twice daily.  8. Hypercholesterolemia:: Her cholesterol and LDL are higher. If these parameters do not improve with tighter diet and exercise, it may make sense to start her on statins.  9. Overweight: her weight has increased, in part due to taking in more carbs and fats and in part due to exercising less.   PLAN: 1. Diagnostic: Repeat TFT, CMP, lipid panel in 6 months.  2. Therapeutic: No Synthroid medication at this time.  Continue losartan. Continue ranitidine, 150 mg, twice daily. Check BP at her clinic and schedule FU with Dr. Larose Kells. 3. Patient education: We discussed her prediabetes and her weight gain. We also discussed how Hashimoto's Dz will probably make her hypothyroid at some time in the future. 4. Follow-Up: FU appointment in 6 months with me.   Level of Service: This visit lasted in excess of 50 minutes. More than 50% of the visit was devoted to counseling.  Sherrlyn Hock, MD, CDE

## 2016-02-12 NOTE — Assessment & Plan Note (Addendum)
Tdap:2014 ;  PNA shot: Never;  Shingles: Never   Female care per Dr Mancel Bale, has an appointment tomorrow  Pap-- 01/2014 with Dr. Everett Graff   MMG-- 10/17/14,(-)  Doing well with diet , exercises regularly    Labs: BMP, CBC, FLP, A1c, AST, ALT

## 2016-02-13 DIAGNOSIS — Z6828 Body mass index (BMI) 28.0-28.9, adult: Secondary | ICD-10-CM | POA: Diagnosis not present

## 2016-02-13 DIAGNOSIS — Z01419 Encounter for gynecological examination (general) (routine) without abnormal findings: Secondary | ICD-10-CM | POA: Diagnosis not present

## 2016-02-13 DIAGNOSIS — Z1231 Encounter for screening mammogram for malignant neoplasm of breast: Secondary | ICD-10-CM | POA: Diagnosis not present

## 2016-02-13 LAB — HM MAMMOGRAPHY

## 2016-02-15 ENCOUNTER — Encounter: Payer: Self-pay | Admitting: Internal Medicine

## 2016-02-15 ENCOUNTER — Ambulatory Visit: Payer: BLUE CROSS/BLUE SHIELD | Admitting: "Endocrinology

## 2016-02-21 ENCOUNTER — Encounter: Payer: Self-pay | Admitting: "Endocrinology

## 2016-02-26 ENCOUNTER — Other Ambulatory Visit: Payer: Self-pay

## 2016-02-26 MED ORDER — LOSARTAN POTASSIUM 25 MG PO TABS: 25.0000 mg | ORAL_TABLET | Freq: Every day | ORAL | Status: DC

## 2016-03-07 ENCOUNTER — Other Ambulatory Visit: Payer: Self-pay | Admitting: "Endocrinology

## 2016-04-20 ENCOUNTER — Other Ambulatory Visit: Payer: Self-pay | Admitting: "Endocrinology

## 2016-05-10 DIAGNOSIS — H16223 Keratoconjunctivitis sicca, not specified as Sjogren's, bilateral: Secondary | ICD-10-CM | POA: Diagnosis not present

## 2016-05-20 ENCOUNTER — Other Ambulatory Visit: Payer: Self-pay | Admitting: "Endocrinology

## 2016-06-14 DIAGNOSIS — H16143 Punctate keratitis, bilateral: Secondary | ICD-10-CM | POA: Diagnosis not present

## 2016-06-14 DIAGNOSIS — H16223 Keratoconjunctivitis sicca, not specified as Sjogren's, bilateral: Secondary | ICD-10-CM | POA: Diagnosis not present

## 2016-07-03 ENCOUNTER — Other Ambulatory Visit: Payer: Self-pay | Admitting: "Endocrinology

## 2016-08-03 ENCOUNTER — Other Ambulatory Visit: Payer: Self-pay | Admitting: "Endocrinology

## 2016-08-22 ENCOUNTER — Ambulatory Visit (INDEPENDENT_AMBULATORY_CARE_PROVIDER_SITE_OTHER): Payer: Self-pay | Admitting: "Endocrinology

## 2016-09-23 ENCOUNTER — Ambulatory Visit (INDEPENDENT_AMBULATORY_CARE_PROVIDER_SITE_OTHER): Payer: Self-pay | Admitting: "Endocrinology

## 2016-10-14 ENCOUNTER — Ambulatory Visit (INDEPENDENT_AMBULATORY_CARE_PROVIDER_SITE_OTHER): Payer: Self-pay | Admitting: "Endocrinology

## 2016-11-06 ENCOUNTER — Telehealth (INDEPENDENT_AMBULATORY_CARE_PROVIDER_SITE_OTHER): Payer: Self-pay

## 2016-11-06 ENCOUNTER — Ambulatory Visit (INDEPENDENT_AMBULATORY_CARE_PROVIDER_SITE_OTHER): Payer: Self-pay | Admitting: "Endocrinology

## 2016-11-06 ENCOUNTER — Other Ambulatory Visit (INDEPENDENT_AMBULATORY_CARE_PROVIDER_SITE_OTHER): Payer: Self-pay

## 2016-11-06 DIAGNOSIS — E0590 Thyrotoxicosis, unspecified without thyrotoxic crisis or storm: Secondary | ICD-10-CM

## 2016-11-06 NOTE — Telephone Encounter (Signed)
Per Wanda Herman, they are waiting for her to return our call to reschedule. Labs have already been put in and will be available when she is ready to get them done

## 2016-11-06 NOTE — Telephone Encounter (Signed)
  Who's calling (name and relationship to patient) :Caroliyn  Best contact [phone]  Provider they TCY:[email or web address]  Reason for call: Patient had blood work done late Dec. Does she need to redo it before next appointment?     PRESCRIPTION REFILL ONLY  Name of prescription:  Pharmacy:

## 2016-11-15 DIAGNOSIS — E0590 Thyrotoxicosis, unspecified without thyrotoxic crisis or storm: Secondary | ICD-10-CM | POA: Diagnosis not present

## 2016-11-15 LAB — COMPREHENSIVE METABOLIC PANEL
ALK PHOS: 57 U/L (ref 33–115)
ALT: 15 U/L (ref 6–29)
AST: 19 U/L (ref 10–35)
Albumin: 4.3 g/dL (ref 3.6–5.1)
BILIRUBIN TOTAL: 0.4 mg/dL (ref 0.2–1.2)
BUN: 13 mg/dL (ref 7–25)
CALCIUM: 9.7 mg/dL (ref 8.6–10.2)
CO2: 28 mmol/L (ref 20–31)
Chloride: 103 mmol/L (ref 98–110)
Creat: 0.94 mg/dL (ref 0.50–1.10)
GLUCOSE: 96 mg/dL (ref 70–99)
POTASSIUM: 4.6 mmol/L (ref 3.5–5.3)
Sodium: 137 mmol/L (ref 135–146)
TOTAL PROTEIN: 7.4 g/dL (ref 6.1–8.1)

## 2016-11-15 LAB — LIPID PANEL
CHOLESTEROL: 204 mg/dL — AB (ref <200)
HDL: 67 mg/dL (ref >50)
LDL Cholesterol: 115 mg/dL — ABNORMAL HIGH (ref <100)
Total CHOL/HDL Ratio: 3 Ratio (ref <5.0)
Triglycerides: 108 mg/dL (ref <150)
VLDL: 22 mg/dL (ref <30)

## 2016-11-15 LAB — T3, FREE: T3, Free: 2.7 pg/mL (ref 2.3–4.2)

## 2016-11-15 LAB — T4, FREE: FREE T4: 1 ng/dL (ref 0.8–1.8)

## 2016-11-15 LAB — TSH: TSH: 1.3 m[IU]/L

## 2016-11-19 ENCOUNTER — Ambulatory Visit (INDEPENDENT_AMBULATORY_CARE_PROVIDER_SITE_OTHER): Payer: Self-pay | Admitting: "Endocrinology

## 2016-11-25 ENCOUNTER — Ambulatory Visit (INDEPENDENT_AMBULATORY_CARE_PROVIDER_SITE_OTHER): Payer: BLUE CROSS/BLUE SHIELD | Admitting: "Endocrinology

## 2016-11-25 VITALS — BP 130/84 | HR 88 | Wt 166.2 lb

## 2016-11-25 DIAGNOSIS — E069 Thyroiditis, unspecified: Secondary | ICD-10-CM

## 2016-11-25 DIAGNOSIS — R946 Abnormal results of thyroid function studies: Secondary | ICD-10-CM

## 2016-11-25 DIAGNOSIS — E049 Nontoxic goiter, unspecified: Secondary | ICD-10-CM

## 2016-11-25 DIAGNOSIS — E663 Overweight: Secondary | ICD-10-CM

## 2016-11-25 DIAGNOSIS — E7800 Pure hypercholesterolemia, unspecified: Secondary | ICD-10-CM | POA: Diagnosis not present

## 2016-11-25 DIAGNOSIS — R1013 Epigastric pain: Secondary | ICD-10-CM

## 2016-11-25 DIAGNOSIS — I10 Essential (primary) hypertension: Secondary | ICD-10-CM | POA: Diagnosis not present

## 2016-11-25 DIAGNOSIS — R7303 Prediabetes: Secondary | ICD-10-CM

## 2016-11-25 DIAGNOSIS — R7989 Other specified abnormal findings of blood chemistry: Secondary | ICD-10-CM

## 2016-11-25 LAB — POCT GLUCOSE (DEVICE FOR HOME USE): POC Glucose: 105 mg/dl — AB (ref 70–99)

## 2016-11-25 LAB — POCT GLYCOSYLATED HEMOGLOBIN (HGB A1C): Hemoglobin A1C: 6

## 2016-11-25 MED ORDER — PRAVASTATIN SODIUM 10 MG PO TABS: 10.0000 mg | ORAL_TABLET | Freq: Every evening | ORAL | 11 refills | Status: DC

## 2016-11-25 NOTE — Progress Notes (Signed)
CC: FU thyroiditis, goiter, transient hyperthyroidism due to flare ups of thyroiditis (Hashitoxicosis), hypertension, and prediabetes  HPI: Wanda Herman is a 46 y.o. African-American woman. She was unaccompanied.  1. I have been following the patient since 01/04/05 for the above issues. In the subsequent 12 years I've seen Wanda Herman in clinic about once every 6 months. She has had multiple flare-ups of Hashimoto's Thyroiditis over the years that have resulted in her being hyperthyroid (Hashitoxicosis), hypothyroid, and euthyroid at different times in between. Her TSH values have varied from 0.098-4.122. Free T4 values have varied from 0.60-1.23. Free T3 values have varied from 2.2-3.3. TPO antibody levels have always been elevated between  52.2-113.3. Fortunately, she has been euthyroid most of the time, so I have never treated her with Synthroid.   2. Her last [facility] visit was on 02/12/16. In the interim she has been healthy, except for occasional allergy symptoms. She has been tired off and on, but she expected that due to her longer working hours. Her stomach feels good and she has not had much belly hunger. She had not been exercising for a long while, but gained "a lot of weight", so resumed exercising 5 days per week about 3 weeks ago. She does at least 30 minutes of cardio each time. She still uses Restasis for her dry eyes and takes ranitidine, twice daily to reduce stomach acid and losartan for BP control. Her daughter is a Academic librarian, so mom is on the road a lot.   3.Pertinent Review of Systems: Constitutional: She feels "okay except for allergies". Her energy levels are "okay, up and down". She is sleeping "well". She does not use caffeine. Her body temperature seems normal to her.    Eyes: Her vision has been good since her lasik surgery four years ago. There are no significant eye complaints. Neck: The patient has no complaints of anterior neck swelling, soreness, tenderness,   pressure, discomfort, or difficulty swallowing.  Heart: Heart rate increases with exercise or other physical activity. The patient has no complaints of palpitations, irregular heat beats, chest pain, or chest pressure. Gastrointestinal: As above. Bowel movements have normalized since stopping metformin. She has no other GI complaints. Legs: Muscle mass and strength seem normal. There are no complaints of numbness, tingling, burning, or pain. No edema is noted. Feet: There are no obvious foot problems. There are no complaints of numbness, tingling, burning, or pain. No edema is noted. GYN: She had a partial hysterectomy in 2013, but still has both ovaries. She still may have some PMS symptoms at times.  Skin: She feels that the darker coloration of her lateral face has continued to lighten, but the darker coloration of her forehead persists.    PAST MEDICAL, FAMILY, AND SOCIAL HISTORY: 1. 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 Wanda Herman mother had menopause in her early 57s, but had premenopausal symptoms beginning in her 84s. 2. Activities: She has been exercising more often recently.    3. Tobacco, alcohol, and illicit drugs: Never 4. PCP: Dr. Larose Kells  REVIEW OF SYSTEMS: Wanda Herman has no other significant complaints involving any of her other body systems.  PHYSICAL EXAM: BP 130/84   Pulse 88   Wt 166 lb 3.2 oz (75.4 kg)   LMP 11/22/2011   BMI 28.53 kg/m  She  has gained 5 pounds.  Constitutional: The patient looks healthy, but a bit heavier and more tired today. She is also taking an allergy medicine now.  Her affect and insight are normal. She is bright and fairly upbeat today.    Face: The dark coloration of the infraorbital areas and areas lateral to the orbits is still evident. She has normal coloration of most  of her face, to include the skin just above her eyebrows.  Eyes: There is no arcus or proptosis.  Mouth: The oropharynx appears normal. The tongue appears normal. There is normal oral moisture. There is no obvious gingivitis. Neck: There are no bruits present. Her strap muscles are smaller, c/w less upper body exercise. The thyroid gland appears mildly enlarged. The thyroid gland is again about 22 grams in size. The right lobe is minimally enlarged today. The left lobe is larger than the right.  There is no thyroid tenderness to palpation. She has only trace acanthosis nigricans.  Lungs: The lungs are clear. Air movement is good. Heart: The heart rhythm and rate appear normal. Heart sounds S1 and S2 are normal. I do not appreciate any pathologic heart murmurs. Abdomen: The abdomen is again enlarged. Bowel sounds are normal. The abdomen is soft and non-tender. There is no obviously palpable hepatomegaly, splenomegaly, or other masses.  Arms: Muscle mass appears appropriate for age.  Hands: There is no obvious tremor. Phalangeal and metacarpophalangeal joints appear normal. Palms are normal. Legs: Muscle mass appears appropriate for age. There is no edema.   Neurologic: Muscle strength is normal for age and gender  in both the upper and the lower extremities. Muscle tone appears normal. Sensation to touch is normal in the legs.   Labs:  Labs 11/25/16: HbA1c 6.0%, CBG 115  Labs 11/15/16: TSH 1.30, free T4 1.0, free T3 2.7; CMP normal; cholesterol 204, triglycerides 108, HDL 67, LDL 115  Labs 02/12/16: BMP normal, except fasting glucose 102; CBC normal; cholesterol 210, triglycerides 89, HDL 66, LDL 127; AST 20, ALT 15; HbA1c 5.9%  Labs 02/07/16: HbA1c 5.7%; TSH 2.08, free T4 1.0, T3 178 (ref. 210-440), total T3 90 (ref. 78-181)  Labs 08/17/15: HbA1c 6.2%; TSH 1.847, free T4 0.86, free T3 2.4;   04/11/15: HbA1c 5.6%  12/12/14: CBC normal; CMP normal; iron 83; C-peptide 6.28 (0.80-3.90)  10/14/14:  TSH 1.925, free T4 0.99, free T3 2.8  09/30/14: HbA1c was 6.1%.   02/08/14: CMP normal; TSH 1.80, free T4 0.65, free T3 2.3; Hgb 12.0, Hct 36; cholesterol 171, triglycerides 59, HDL 688, LDL 90;   08/18/13: Normal TFTs (TSH 2.646, free T4 0.88, free T3 2.6)  06/30/12: Normal TFTs (TSH 1.508, free T4 1.01, free T3 2.7), normal CMP  07/06/11: Normal TFTs (TSH 2.674, free T4 1.23, free T3 2.4)  ASSESSMENT: 1-2. Goiter/thyroiditis:   A. The thyroid gland is about the same size today, although the right lobe is probably a bit smaller and the left lobe a bit larger.  The process of waxing and waning of thyroid gland size is c/w evolving Hashimoto's thyroiditis.   B. The wide degree of fluctuations of her TFTs at different points in time is also c/w evolving thyroiditis.   C. From December 2014 to June 2015 all three TFTs decreased in parallel. From June 2015 to February 2016 all three TFTs increased in parallel. These shifts, in which all three of the TFTs shift upward or downward in the same direction, are pathognomonic for interim flare ups of Hashimoto's disease.  3. Abnormal TFTs/transient hypothyroidism:   A. She has been euthyroid for the past 6 years.   B. Her recent TFTs in March 2018 were mid-euthyroid, at about the 55% of the normal range.   C. She does not need Synthroid treatment at this time, but she might need it in the future.  4. Hypertension: Her SBP and DBP are still mildly elevated as they were at her last visit. I would like to see her maintain her cardio exercise to 45-60 minutes 3-4 times per week.  5. Prediabetes: Her HbA1c had normalized on metformin, but she was in the 5% of people who did not tolerate metformin. Her A1c was higher in December 2016, lower in June 2017, but higher again in April 2018 after having gained 6 pounds. She will need to work harder on eating right and exercising.  6. Fatigue, other:  She has remained euthyroid. I suspect that when she is stressed she  does not sleep well.     7. Dyspepsia: Her dyspepsia is better. She is doing better with ranitidine twice daily.  8. Hypercholesterolemia: Her cholesterol and LDL are still too high. It makes sense to start her on low-dose statin therapy with pravastatin, the statin that is least likely to cause adverse effects. .  9. Overweight: Her weight has increased, in part due to taking in more carbs and fats and in part due to exercising less.   PLAN: 1. Diagnostic: Repeat CMP and  lipid panel in 2-3 months. Repeat the TFTs in 12 months.  2. Therapeutic: No Synthroid medication at this time. Continue losartan. Continue ranitidine, 150 mg, twice daily. Start pravastatin, 10 mg daily. Check BP at her clinic and schedule FU with Dr. Larose Kells. 3. Patient education: We discussed her prediabetes and her weight gain. We also discussed her hypercholesterolemia. We also discussed how Hashimoto's Dz will probably make her hypothyroid at some time in the future. 4. Follow-Up: FU appointment in 6 months with me.   Level of Service: This visit lasted in excess of 50 minutes. More than 50% of the visit was devoted to counseling.  Tillman Sers, MD, CDE Adult and Pediatric Endocrinology

## 2016-11-25 NOTE — Patient Instructions (Signed)
Follow up visit in 6 months. 

## 2016-11-26 ENCOUNTER — Encounter (INDEPENDENT_AMBULATORY_CARE_PROVIDER_SITE_OTHER): Payer: Self-pay | Admitting: "Endocrinology

## 2016-12-10 ENCOUNTER — Other Ambulatory Visit (INDEPENDENT_AMBULATORY_CARE_PROVIDER_SITE_OTHER): Payer: Self-pay | Admitting: *Deleted

## 2016-12-10 DIAGNOSIS — E7800 Pure hypercholesterolemia, unspecified: Secondary | ICD-10-CM

## 2016-12-10 MED ORDER — PRAVASTATIN SODIUM 10 MG PO TABS: 10.0000 mg | ORAL_TABLET | Freq: Every evening | ORAL | 4 refills | Status: DC

## 2016-12-12 ENCOUNTER — Other Ambulatory Visit (INDEPENDENT_AMBULATORY_CARE_PROVIDER_SITE_OTHER): Payer: Self-pay | Admitting: *Deleted

## 2016-12-12 DIAGNOSIS — E7800 Pure hypercholesterolemia, unspecified: Secondary | ICD-10-CM

## 2016-12-12 MED ORDER — PRAVASTATIN SODIUM 10 MG PO TABS: 10.0000 mg | ORAL_TABLET | Freq: Every evening | ORAL | 4 refills | Status: DC

## 2017-01-17 DIAGNOSIS — H524 Presbyopia: Secondary | ICD-10-CM | POA: Diagnosis not present

## 2017-01-17 DIAGNOSIS — H5213 Myopia, bilateral: Secondary | ICD-10-CM | POA: Diagnosis not present

## 2017-01-20 DIAGNOSIS — L814 Other melanin hyperpigmentation: Secondary | ICD-10-CM | POA: Diagnosis not present

## 2017-01-20 DIAGNOSIS — D225 Melanocytic nevi of trunk: Secondary | ICD-10-CM | POA: Diagnosis not present

## 2017-01-20 DIAGNOSIS — L821 Other seborrheic keratosis: Secondary | ICD-10-CM | POA: Diagnosis not present

## 2017-01-20 DIAGNOSIS — D1800 Hemangioma unspecified site: Secondary | ICD-10-CM | POA: Diagnosis not present

## 2017-02-04 ENCOUNTER — Other Ambulatory Visit: Payer: Self-pay | Admitting: "Endocrinology

## 2017-02-14 ENCOUNTER — Encounter: Payer: BLUE CROSS/BLUE SHIELD | Admitting: Internal Medicine

## 2017-03-05 DIAGNOSIS — Z1231 Encounter for screening mammogram for malignant neoplasm of breast: Secondary | ICD-10-CM | POA: Diagnosis not present

## 2017-03-05 DIAGNOSIS — Z01419 Encounter for gynecological examination (general) (routine) without abnormal findings: Secondary | ICD-10-CM | POA: Diagnosis not present

## 2017-03-05 DIAGNOSIS — Z124 Encounter for screening for malignant neoplasm of cervix: Secondary | ICD-10-CM | POA: Diagnosis not present

## 2017-03-05 DIAGNOSIS — Z6828 Body mass index (BMI) 28.0-28.9, adult: Secondary | ICD-10-CM | POA: Diagnosis not present

## 2017-03-05 LAB — HM MAMMOGRAPHY

## 2017-03-11 ENCOUNTER — Encounter: Payer: Self-pay | Admitting: Internal Medicine

## 2017-03-27 ENCOUNTER — Other Ambulatory Visit: Payer: Self-pay | Admitting: Internal Medicine

## 2017-06-15 ENCOUNTER — Other Ambulatory Visit: Payer: Self-pay | Admitting: Internal Medicine

## 2017-07-09 ENCOUNTER — Ambulatory Visit (INDEPENDENT_AMBULATORY_CARE_PROVIDER_SITE_OTHER): Payer: BLUE CROSS/BLUE SHIELD | Admitting: Podiatry

## 2017-07-09 ENCOUNTER — Encounter: Payer: Self-pay | Admitting: Podiatry

## 2017-07-09 VITALS — BP 133/81 | HR 65 | Ht 63.0 in | Wt 161.0 lb

## 2017-07-09 DIAGNOSIS — M21962 Unspecified acquired deformity of left lower leg: Secondary | ICD-10-CM | POA: Diagnosis not present

## 2017-07-09 DIAGNOSIS — M722 Plantar fascial fibromatosis: Secondary | ICD-10-CM | POA: Diagnosis not present

## 2017-07-09 DIAGNOSIS — M216X2 Other acquired deformities of left foot: Secondary | ICD-10-CM | POA: Diagnosis not present

## 2017-07-09 NOTE — Progress Notes (Signed)
SUBJECTIVE: 46 y.o. year old female presents complaining of pain in left foot, which started in June 2018. Started to have muscle cramp on left foot after a period of exercise. Pain started from left plantar lateral heel and extended to anterior part of foot. It is cramping and aching sensation, which continued even after a period of resting. This happened in June 2018. Pain lasted for a month.  Now foot foot is much better but still hurts at times after been on or after exercise.   Review of Systems  Constitutional: Negative.   HENT: Negative.   Eyes: Negative.   Respiratory: Negative.   Cardiovascular: Negative.   Gastrointestinal: Negative.   Genitourinary: Negative.   Musculoskeletal: Negative.   Skin: Negative.     OBJECTIVE: DERMATOLOGIC EXAMINATION: No abnormal skin lesions noted.  VASCULAR EXAMINATION OF LOWER LIMBS: All pedal pulses are palpable with normal pulsation.  Capillary Filling times within 3 seconds in all digits.  No edema or erythema noted. Temperature gradient from tibial crest to dorsum of foot is within normal bilateral.  NEUROLOGIC EXAMINATION OF THE LOWER LIMBS: All epicritic and tactile sensations grossly intact. Sharp and Dull discriminatory sensations at the plantar ball of hallux is intact bilateral.   MUSCULOSKELETAL EXAMINATION: Positive for excess sagittal plane motion of the first metatarsal bone with forefoot loading left foot. Excess STJ pronation with weight bearing left.   ASSESSMENT: Plantar fasciitis left. Hypermobile first ray left. STJ hyperpronation left foot.  PLAN: Reviewed clinical findings and available treatment options. Metatarsal binder dispensed to add strength to mid foot during activity. May benefit from custom orthotics. Proper shoe gear reviewed. Return for custom orthotics.

## 2017-07-09 NOTE — Patient Instructions (Signed)
Seen for pain in left foot. Noted of weak first ray with excess motion of the first metatarsal with forefoot loading. Metatarsal binder dispensed x 1 to add stability of the first ray. May benefit from custom orthotics. Will call with benefits.

## 2017-07-28 ENCOUNTER — Telehealth: Payer: Self-pay | Admitting: Internal Medicine

## 2017-07-28 ENCOUNTER — Encounter: Payer: BLUE CROSS/BLUE SHIELD | Admitting: Internal Medicine

## 2017-07-28 DIAGNOSIS — Z0000 Encounter for general adult medical examination without abnormal findings: Secondary | ICD-10-CM

## 2017-07-28 NOTE — Telephone Encounter (Signed)
Copied from [place]. Topic: Inquiry >> Jul 28, 2017  9:14 AM Wanda Herman B wrote: Reason for CRM: pt has a cpe on 12.26.18, if possible pt would like fasting labs done before appt, contact pt to advise

## 2017-07-30 NOTE — Telephone Encounter (Signed)
LMOM informing Pt to call office to schedule lab appt [fasting]. Orders have been placed.

## 2017-07-30 NOTE — Telephone Encounter (Signed)
Ok to get: CMP, FLP, CBC, A1c.

## 2017-07-30 NOTE — Telephone Encounter (Signed)
Please advise 

## 2017-08-04 DIAGNOSIS — H16223 Keratoconjunctivitis sicca, not specified as Sjogren's, bilateral: Secondary | ICD-10-CM | POA: Diagnosis not present

## 2017-08-06 ENCOUNTER — Encounter: Payer: Self-pay | Admitting: Podiatry

## 2017-08-06 ENCOUNTER — Other Ambulatory Visit (INDEPENDENT_AMBULATORY_CARE_PROVIDER_SITE_OTHER): Payer: BLUE CROSS/BLUE SHIELD

## 2017-08-06 ENCOUNTER — Ambulatory Visit (INDEPENDENT_AMBULATORY_CARE_PROVIDER_SITE_OTHER): Payer: BLUE CROSS/BLUE SHIELD | Admitting: Podiatry

## 2017-08-06 DIAGNOSIS — M216X2 Other acquired deformities of left foot: Secondary | ICD-10-CM

## 2017-08-06 DIAGNOSIS — E0580 Other thyrotoxicosis without thyrotoxic crisis or storm: Secondary | ICD-10-CM

## 2017-08-06 DIAGNOSIS — E063 Autoimmune thyroiditis: Secondary | ICD-10-CM

## 2017-08-06 DIAGNOSIS — M722 Plantar fascial fibromatosis: Secondary | ICD-10-CM | POA: Diagnosis not present

## 2017-08-06 DIAGNOSIS — M21962 Unspecified acquired deformity of left lower leg: Secondary | ICD-10-CM

## 2017-08-06 DIAGNOSIS — Z0000 Encounter for general adult medical examination without abnormal findings: Secondary | ICD-10-CM | POA: Diagnosis not present

## 2017-08-06 LAB — CBC WITH DIFFERENTIAL/PLATELET
BASOS PCT: 1.7 % (ref 0.0–3.0)
Basophils Absolute: 0.1 10*3/uL (ref 0.0–0.1)
EOS PCT: 3.1 % (ref 0.0–5.0)
Eosinophils Absolute: 0.1 10*3/uL (ref 0.0–0.7)
HEMATOCRIT: 39.8 % (ref 36.0–46.0)
HEMOGLOBIN: 13.3 g/dL (ref 12.0–15.0)
LYMPHS PCT: 44.7 % (ref 12.0–46.0)
Lymphs Abs: 2 10*3/uL (ref 0.7–4.0)
MCHC: 33.5 g/dL (ref 30.0–36.0)
MCV: 92.5 fl (ref 78.0–100.0)
Monocytes Absolute: 0.4 10*3/uL (ref 0.1–1.0)
Monocytes Relative: 8 % (ref 3.0–12.0)
Neutro Abs: 2 10*3/uL (ref 1.4–7.7)
Neutrophils Relative %: 42.5 % — ABNORMAL LOW (ref 43.0–77.0)
Platelets: 292 10*3/uL (ref 150.0–400.0)
RBC: 4.3 Mil/uL (ref 3.87–5.11)
RDW: 12.8 % (ref 11.5–15.5)
WBC: 4.6 10*3/uL (ref 4.0–10.5)

## 2017-08-06 LAB — COMPREHENSIVE METABOLIC PANEL
ALT: 11 U/L (ref 0–35)
AST: 16 U/L (ref 0–37)
Albumin: 4.3 g/dL (ref 3.5–5.2)
Alkaline Phosphatase: 54 U/L (ref 39–117)
BUN: 12 mg/dL (ref 6–23)
CALCIUM: 9 mg/dL (ref 8.4–10.5)
CHLORIDE: 104 meq/L (ref 96–112)
CO2: 28 meq/L (ref 19–32)
CREATININE: 0.9 mg/dL (ref 0.40–1.20)
GFR: 86.33 mL/min (ref >60.00)
Glucose, Bld: 102 mg/dL — ABNORMAL HIGH (ref 70–99)
Potassium: 4.3 mEq/L (ref 3.5–5.1)
SODIUM: 139 meq/L (ref 135–145)
Total Bilirubin: 0.7 mg/dL (ref 0.2–1.2)
Total Protein: 7.2 g/dL (ref 6.0–8.3)

## 2017-08-06 LAB — T3, FREE: T3, Free: 3.3 pg/mL (ref 2.3–4.2)

## 2017-08-06 LAB — LIPID PANEL
CHOL/HDL RATIO: 3
Cholesterol: 160 mg/dL (ref 0–200)
HDL: 61 mg/dL (ref >39.00)
LDL CALC: 87 mg/dL (ref 0–99)
NonHDL: 99.3
Triglycerides: 63 mg/dL (ref 0.0–149.0)
VLDL: 12.6 mg/dL (ref 0.0–40.0)

## 2017-08-06 LAB — HEMOGLOBIN A1C: Hgb A1c MFr Bld: 6.2 % (ref 4.6–6.5)

## 2017-08-06 LAB — TSH: TSH: 3.11 u[IU]/mL (ref 0.35–4.50)

## 2017-08-06 LAB — T4, FREE: FREE T4: 0.67 ng/dL (ref 0.60–1.60)

## 2017-08-06 NOTE — Patient Instructions (Signed)
Both feet casted for Orthotics. Continue with Metatarsal binder.  Will call when they are ready.

## 2017-08-06 NOTE — Progress Notes (Signed)
Subjective: 46 y.o. year old female patient presents to have orthotics prepared. Been using metatarsal binder and noted of benefits from it.  History: Started to have muscle cramp on left foot after a period of exercise that started in June 2018. Pain started from left plantar lateral heel and extended to anterior part of foot. It is cramping and aching sensation, which continued even after a period of resting. Now foot foot is much better but still hurts at times after been on or after exercise.   Objective: Dermatologic: Normal findings. Vascular: Pedal pulses are all palpable. Orthopedic: Positive of hypermobile first ray bilateral with loading of forefoot. Positive of excess STJ pronation with weight bearing. Pain on plantar lateral heel left with increased activity. Neurologic: All epicritic and tactile sensations grossly intact. Radiographic examination reveal adducted metatarsal bones, Fibular sesamoid bone at 4 bilateral. High arched cavus type foot with anterior advancement of Talar head bilateral. First ray elevation is minimum.  Assessment: Plantar fasciitis left. Hypermobile first ray left. Compensated rearfoot varus bilateral.  Treatment: Reviewed findings and available treatment options. Continue with metatarsal binder during activity. Both feet casted for orthotics.

## 2017-08-07 ENCOUNTER — Encounter: Payer: Self-pay | Admitting: Podiatry

## 2017-08-07 NOTE — Progress Notes (Signed)
Opened in error

## 2017-08-13 ENCOUNTER — Encounter: Payer: Self-pay | Admitting: Internal Medicine

## 2017-08-13 ENCOUNTER — Ambulatory Visit (INDEPENDENT_AMBULATORY_CARE_PROVIDER_SITE_OTHER): Payer: BLUE CROSS/BLUE SHIELD | Admitting: Internal Medicine

## 2017-08-13 DIAGNOSIS — Z0000 Encounter for general adult medical examination without abnormal findings: Secondary | ICD-10-CM | POA: Diagnosis not present

## 2017-08-13 MED ORDER — RANITIDINE HCL 150 MG PO TABS: 150.0000 mg | ORAL_TABLET | Freq: Two times a day (BID) | ORAL | 3 refills | Status: DC

## 2017-08-13 MED ORDER — LOSARTAN POTASSIUM 25 MG PO TABS: 25.0000 mg | ORAL_TABLET | Freq: Every day | ORAL | 3 refills | Status: DC

## 2017-08-13 NOTE — Progress Notes (Signed)
Pre visit review using our clinic review tool, if applicable. No additional management support is needed unless otherwise documented below in the visit note. 

## 2017-08-13 NOTE — Assessment & Plan Note (Signed)
--  Tdap:2014 ;  PNA shot: Never;  Shingles: Never ; had a flu shot @ work --Female care per Dr Mancel Bale [facility] [phone] --Diet and exercise discussed -All recent labs reviewed with the patient.

## 2017-08-13 NOTE — Patient Instructions (Signed)
GO TO THE FRONT DESK Schedule your next appointment for a  Physical exam in 1 year

## 2017-08-13 NOTE — Progress Notes (Signed)
Subjective:    Patient ID: Wanda Herman, female    DOB: 1971-03-06, 46 y.o.   MRN: [id number]  DOS:  08/13/2017 Type of visit - description : cpx Interval history: No major concerns    Review of Systems Several years history of B ear pressure on and off, saw ENT before, evaluation led her to be prescribed nasal sprays. Currently with no allergy symptoms, no tinnitus or decreased hearing just occ pressure  Other than above, a 14 point review of systems is negative    Past Medical History:  Diagnosis Date  . Breast mass, left 07/08/06  . Breast mass, right 05/30/04  . Fibroid 08/21/2009  . Gestational diabetes 2005  . Goiter   . H/O candidiasis   . H/O varicella   . Headache(784.0)   . Hypertension   . Prediabetes 09/30/2014  . Thyroiditis, autoimmune     Past Surgical History:  Procedure Laterality Date  . ABDOMINAL HYSTERECTOMY  12/04/2011   HYSTERECTOMY ABDOMINAL; NO Oophorectomy---  Surgeon: Delice Lesch, MD;  Location: [facility];  Service: Gynecology;  Laterality: N/A;  . CYSTOSCOPY  12/04/2011   Procedure: CYSTOSCOPY;  Surgeon: Delice Lesch, MD;  Location: [facility];  Service: Gynecology;  Laterality: N/A;  . LASIK      Social History   Socioeconomic History  . Marital status: Married    Spouse name: Not on file  . Number of children: 2  . Years of education: Not on file  . Highest education level: Not on file  Social Needs  . Financial resource strain: Not on file  . Food insecurity - worry: Not on file  . Food insecurity - inability: Not on file  . Transportation needs - medical: Not on file  . Transportation needs - non-medical: Not on file  Occupational History  . Occupation: Aeronautical engineer: [employer]  Tobacco Use  . Smoking status: Never Smoker  . Smokeless tobacco: Never Used  Substance and Sexual Activity  . Alcohol use: No  . Drug use: No  . Sexual activity: Yes    Birth control/protection: Pill    Comment:  tri-sprinted  Other Topics Concern  . Not on file  Social History Narrative   Lives w/ husband and two children : female 1999, 2005      Family History  Problem Relation Age of Onset  . Diabetes Maternal Grandmother   . Hypertension Maternal Grandmother   . Ovarian cancer Maternal Aunt        ovarian cancer  . Diabetes Father   . Heart attack Other        GF at 46 y/o  . Diabetes Mother   . Hypertension Mother   . Hyperlipidemia Mother   . Colon cancer Neg Hx   . Breast cancer Neg Hx      Allergies as of 08/13/2017   No Known Allergies     Medication List        Accurate as of 08/13/17 11:59 PM. Always use your most recent med list.          cycloSPORINE 0.05 % ophthalmic emulsion Commonly known as:  RESTASIS 1 drop 2 (two) times daily.   losartan 25 MG tablet Commonly known as:  COZAAR Take 1 tablet (25 mg total) by mouth daily.   pravastatin 10 MG tablet Commonly known as:  PRAVACHOL Take 1 tablet (10 mg total) by mouth every evening.   ranitidine 150 MG tablet Commonly known as:  ZANTAC Take 1 tablet (150 mg total) by mouth 2 (two) times daily.          Objective:   Physical Exam BP 133/80 (BP Location: Left Arm, Patient Position: Sitting, Cuff Size: Small)   Pulse 70   Temp 97.8 F (36.6 C) (Oral)   Resp 14   Ht 5\' 3"  (1.6 m)   Wt 161 lb 4 oz (73.1 kg)   LMP 11/22/2011   SpO2 100%   BMI 28.56 kg/m  General:   Well developed, well nourished . NAD.  Neck: No  thyromegaly  HEENT:  Normocephalic . Face symmetric, atraumatic.  TMs slightly bulging but otherwise normal Lungs:  CTA B Normal respiratory effort, no intercostal retractions, no accessory muscle use. Heart: RRR,  no murmur.  No pretibial edema bilaterally  Abdomen:  Not distended, soft, non-tender. No rebound or rigidity.   Skin: Exposed areas without rash. Not pale. Not jaundice Neurologic:  alert & oriented X3.  Speech normal, gait appropriate for age and  unassisted Strength symmetric and appropriate for age.  Psych: Cognition and judgment appear intact.  Cooperative with normal attention span and concentration.  Behavior appropriate. No anxious or depressed appearing.     Assessment & Plan:   Assessment Prediabetes -- metformin intolerant, gi sx. Self d/c 2016 HTN Reactive airway dz? Never rx a inhaler as off 01-2016 Thyroid: h/o autoimmune thyroiditis, goiter, Dr. Tobe Sos  Migraines  Dyspepsia -- on zantac Dry eyes-- sees eye doctor x 2/ year   PLAN: WH:QPRF A1c 6.2, diet and exercise discussed. HTN: On losartan, BP today is very good, she does check on and off, last BMP satisfactory High cholesterol: on Pravachol, last FLP satisfactory. Autoimmune thyroiditis: Plans to see endocrinology yearly, last TSH satisfactory Dyspepsia: RF ranitidine  Ear pressure: Chronic issue, saw ENT before, pt will consider re-referral RTC 1 year

## 2017-08-14 NOTE — Assessment & Plan Note (Signed)
JY:NWGN A1c 6.2, diet and exercise discussed. HTN: On losartan, BP today is very good, she does check on and off, last BMP satisfactory High cholesterol: on Pravachol, last FLP satisfactory. Autoimmune thyroiditis: Plans to see endocrinology yearly, last TSH satisfactory Dyspepsia: RF ranitidine  Ear pressure: Chronic issue, saw ENT before, pt will consider re-referral RTC 1 year

## 2017-08-15 ENCOUNTER — Other Ambulatory Visit: Payer: Self-pay | Admitting: "Endocrinology

## 2017-11-11 ENCOUNTER — Other Ambulatory Visit (INDEPENDENT_AMBULATORY_CARE_PROVIDER_SITE_OTHER): Payer: Self-pay | Admitting: *Deleted

## 2017-11-11 MED ORDER — RANITIDINE HCL 150 MG PO TABS: 150.0000 mg | ORAL_TABLET | Freq: Two times a day (BID) | ORAL | 1 refills | Status: DC

## 2018-01-26 DIAGNOSIS — H52223 Regular astigmatism, bilateral: Secondary | ICD-10-CM | POA: Diagnosis not present

## 2018-01-26 DIAGNOSIS — H5213 Myopia, bilateral: Secondary | ICD-10-CM | POA: Diagnosis not present

## 2018-01-26 DIAGNOSIS — H524 Presbyopia: Secondary | ICD-10-CM | POA: Diagnosis not present

## 2018-03-10 DIAGNOSIS — Z1231 Encounter for screening mammogram for malignant neoplasm of breast: Secondary | ICD-10-CM | POA: Diagnosis not present

## 2018-03-10 DIAGNOSIS — Z01419 Encounter for gynecological examination (general) (routine) without abnormal findings: Secondary | ICD-10-CM | POA: Diagnosis not present

## 2018-03-10 LAB — HM MAMMOGRAPHY

## 2018-03-12 ENCOUNTER — Encounter: Payer: Self-pay | Admitting: Internal Medicine

## 2018-03-14 ENCOUNTER — Other Ambulatory Visit: Payer: Self-pay | Admitting: "Endocrinology

## 2018-03-14 DIAGNOSIS — E7800 Pure hypercholesterolemia, unspecified: Secondary | ICD-10-CM

## 2018-07-01 DIAGNOSIS — J00 Acute nasopharyngitis [common cold]: Secondary | ICD-10-CM | POA: Diagnosis not present

## 2018-07-31 DIAGNOSIS — H16223 Keratoconjunctivitis sicca, not specified as Sjogren's, bilateral: Secondary | ICD-10-CM | POA: Diagnosis not present

## 2018-08-04 DIAGNOSIS — D225 Melanocytic nevi of trunk: Secondary | ICD-10-CM | POA: Diagnosis not present

## 2018-08-04 DIAGNOSIS — D2271 Melanocytic nevi of right lower limb, including hip: Secondary | ICD-10-CM | POA: Diagnosis not present

## 2018-08-04 DIAGNOSIS — L814 Other melanin hyperpigmentation: Secondary | ICD-10-CM | POA: Diagnosis not present

## 2018-08-04 DIAGNOSIS — Z23 Encounter for immunization: Secondary | ICD-10-CM | POA: Diagnosis not present

## 2018-08-04 DIAGNOSIS — L821 Other seborrheic keratosis: Secondary | ICD-10-CM | POA: Diagnosis not present

## 2018-08-06 ENCOUNTER — Other Ambulatory Visit: Payer: Self-pay | Admitting: Obstetrics and Gynecology

## 2018-08-06 DIAGNOSIS — N6310 Unspecified lump in the right breast, unspecified quadrant: Secondary | ICD-10-CM | POA: Diagnosis not present

## 2018-08-06 DIAGNOSIS — N644 Mastodynia: Secondary | ICD-10-CM

## 2018-08-14 ENCOUNTER — Ambulatory Visit (INDEPENDENT_AMBULATORY_CARE_PROVIDER_SITE_OTHER): Payer: BLUE CROSS/BLUE SHIELD | Admitting: Internal Medicine

## 2018-08-14 ENCOUNTER — Encounter: Payer: Self-pay | Admitting: Internal Medicine

## 2018-08-14 VITALS — BP 123/79 | HR 63 | Temp 98.6°F | Resp 16 | Ht 63.0 in | Wt 168.0 lb

## 2018-08-14 DIAGNOSIS — Z0000 Encounter for general adult medical examination without abnormal findings: Secondary | ICD-10-CM | POA: Diagnosis not present

## 2018-08-14 DIAGNOSIS — E063 Autoimmune thyroiditis: Secondary | ICD-10-CM

## 2018-08-14 DIAGNOSIS — E7800 Pure hypercholesterolemia, unspecified: Secondary | ICD-10-CM | POA: Diagnosis not present

## 2018-08-14 LAB — COMPREHENSIVE METABOLIC PANEL
ALBUMIN: 4.4 g/dL (ref 3.5–5.2)
ALK PHOS: 57 U/L (ref 39–117)
ALT: 13 U/L (ref 0–35)
AST: 16 U/L (ref 0–37)
BUN: 12 mg/dL (ref 6–23)
CO2: 27 mEq/L (ref 19–32)
CREATININE: 0.82 mg/dL (ref 0.40–1.20)
Calcium: 9.3 mg/dL (ref 8.4–10.5)
Chloride: 106 mEq/L (ref 96–112)
GFR: 95.7 mL/min (ref >60.00)
Glucose, Bld: 105 mg/dL — ABNORMAL HIGH (ref 70–99)
Potassium: 4.1 mEq/L (ref 3.5–5.1)
SODIUM: 141 meq/L (ref 135–145)
TOTAL PROTEIN: 6.9 g/dL (ref 6.0–8.3)
Total Bilirubin: 0.4 mg/dL (ref 0.2–1.2)

## 2018-08-14 LAB — CBC WITH DIFFERENTIAL/PLATELET
Basophils Absolute: 0.1 10*3/uL (ref 0.0–0.1)
Basophils Relative: 1.2 % (ref 0.0–3.0)
EOS ABS: 0.2 10*3/uL (ref 0.0–0.7)
Eosinophils Relative: 4.2 % (ref 0.0–5.0)
HCT: 38 % (ref 36.0–46.0)
HEMOGLOBIN: 13 g/dL (ref 12.0–15.0)
Lymphocytes Relative: 39.8 % (ref 12.0–46.0)
Lymphs Abs: 2.2 10*3/uL (ref 0.7–4.0)
MCHC: 34.2 g/dL (ref 30.0–36.0)
MCV: 91.3 fl (ref 78.0–100.0)
MONO ABS: 0.4 10*3/uL (ref 0.1–1.0)
Monocytes Relative: 6.8 % (ref 3.0–12.0)
Neutro Abs: 2.6 10*3/uL (ref 1.4–7.7)
Neutrophils Relative %: 48 % (ref 43.0–77.0)
Platelets: 321 10*3/uL (ref 150.0–400.0)
RBC: 4.16 Mil/uL (ref 3.87–5.11)
RDW: 12.8 % (ref 11.5–15.5)
WBC: 5.5 10*3/uL (ref 4.0–10.5)

## 2018-08-14 LAB — T4, FREE: Free T4: 0.62 ng/dL (ref 0.60–1.60)

## 2018-08-14 LAB — LIPID PANEL
CHOLESTEROL: 200 mg/dL (ref 0–200)
HDL: 58.9 mg/dL (ref >39.00)
LDL CALC: 126 mg/dL — AB (ref 0–99)
NonHDL: 141.39
Total CHOL/HDL Ratio: 3
Triglycerides: 77 mg/dL (ref 0.0–149.0)
VLDL: 15.4 mg/dL (ref 0.0–40.0)

## 2018-08-14 LAB — T3, FREE: T3, Free: 3.3 pg/mL (ref 2.3–4.2)

## 2018-08-14 LAB — HEMOGLOBIN A1C: Hgb A1c MFr Bld: 6.1 % (ref 4.6–6.5)

## 2018-08-14 LAB — TSH: TSH: 2.18 u[IU]/mL (ref 0.35–4.50)

## 2018-08-14 MED ORDER — FAMOTIDINE 40 MG PO TABS: 40.0000 mg | ORAL_TABLET | Freq: Every day | ORAL | 6 refills | Status: DC

## 2018-08-14 MED ORDER — AZELASTINE HCL 0.1 % NA SOLN: 2.0000 | Freq: Every evening | NASAL | 3 refills | Status: DC | PRN

## 2018-08-14 MED ORDER — PRAVASTATIN SODIUM 10 MG PO TABS: 10.0000 mg | ORAL_TABLET | Freq: Every evening | ORAL | 4 refills | Status: DC

## 2018-08-14 MED ORDER — LOSARTAN POTASSIUM 25 MG PO TABS: 25.0000 mg | ORAL_TABLET | Freq: Every day | ORAL | 3 refills | Status: DC

## 2018-08-14 NOTE — Patient Instructions (Signed)
GO TO THE LAB : Get the blood work     GO TO THE FRONT DESK Schedule your next appointment for a  Physical exam in 1 year     Check the  blood pressure   Monthly  Be sure your blood pressure is between 110/65 and  135/85. If it is consistently higher or lower, let me know   Allergies: Flonase daily Astelin twice a day claritin 10 mg OTC daily

## 2018-08-14 NOTE — Progress Notes (Signed)
Subjective:    Patient ID: Wanda Herman, female    DOB: Nov 16, 1970, 47 y.o.   MRN: [id number]  DOS:  08/14/2018 Type of visit - description: CPX No major concerns Has allergies symptoms on and off for few months, no fever chills, + hoarseness, nasal congestion and watery eyes.   Review of Systems  Other than above, a 14 point review of systems is negative  Past Medical History:  Diagnosis Date  . Breast mass, left 07/08/06  . Breast mass, right 05/30/04  . Fibroid 08/21/2009  . Gestational diabetes 2005  . Goiter   . H/O candidiasis   . H/O varicella   . Headache(784.0)   . Hypertension   . Prediabetes 09/30/2014  . Thyroiditis, autoimmune     Past Surgical History:  Procedure Laterality Date  . ABDOMINAL HYSTERECTOMY  12/04/2011   HYSTERECTOMY ABDOMINAL; NO Oophorectomy---  Surgeon: Wanda Lesch, MD;  Location: [facility];  Service: Gynecology;  Laterality: N/A;  . CYSTOSCOPY  12/04/2011   Procedure: CYSTOSCOPY;  Surgeon: Wanda Lesch, MD;  Location: [facility];  Service: Gynecology;  Laterality: N/A;  . LASIK      Social History   Socioeconomic History  . Marital status: Married    Spouse name: Not on file  . Number of children: 2  . Years of education: Not on file  . Highest education level: Not on file  Occupational History  . Occupation: Aeronautical engineer: [employer]  . Financial resource strain: Not on file  . Food insecurity:    Worry: Not on file    Inability: Not on file  . Transportation needs:    Medical: Not on file    Non-medical: Not on file  Tobacco Use  . Smoking status: Never Smoker  . Smokeless tobacco: Never Used  Substance and Sexual Activity  . Alcohol use: No  . Drug use: No  . Sexual activity: Yes    Birth control/protection: Pill    Comment: tri-sprinted  Lifestyle  . Physical activity:    Days per week: Not on file    Minutes per session: Not on file  . Stress: Not on file  Relationships    . Social connections:    Talks on phone: Not on file    Gets together: Not on file    Attends religious service: Not on file    Active member of club or organization: Not on file    Attends meetings of clubs or organizations: Not on file    Relationship status: Not on file  . Intimate partner violence:    Fear of current or ex partner: Not on file    Emotionally abused: Not on file    Physically abused: Not on file    Forced sexual activity: Not on file  Other Topics Concern  . Not on file  Social History Narrative   Lives w/ husband and two children : female 1999, 2005      Family History  Problem Relation Age of Onset  . Diabetes Maternal Grandmother   . Hypertension Maternal Grandmother   . Ovarian cancer Maternal Aunt        ovarian cancer  . Diabetes Father   . Heart attack Other        GF at 48 y/o  . Diabetes Mother   . Hypertension Mother   . Hyperlipidemia Mother   . Colon cancer Neg Hx   . Breast cancer Neg Hx  Allergies as of 08/14/2018   No Known Allergies     Medication List       Accurate as of August 14, 2018  1:20 PM. Always use your most recent med list.        azelastine 0.1 % nasal spray Commonly known as:  ASTELIN Place 2 sprays into both nostrils at bedtime as needed for rhinitis.   cycloSPORINE 0.05 % ophthalmic emulsion Commonly known as:  RESTASIS 1 drop 2 (two) times daily.   famotidine 40 MG tablet Commonly known as:  PEPCID Take 1 tablet (40 mg total) by mouth daily.   losartan 25 MG tablet Commonly known as:  COZAAR Take 1 tablet (25 mg total) by mouth daily.   pravastatin 10 MG tablet Commonly known as:  PRAVACHOL Take 1 tablet (10 mg total) by mouth every evening.           Objective:   Physical Exam BP 123/79 (BP Location: Right Arm, Patient Position: Sitting, Cuff Size: Small)   Pulse 63   Temp 98.6 F (37 C) (Oral)   Resp 16   Ht 5\' 3"  (1.6 m)   Wt 168 lb (76.2 kg)   LMP 11/22/2011   SpO2 100%    BMI 29.76 kg/m  General: Well developed, NAD, BMI noted Neck: No  thyromegaly  HEENT:  Normocephalic . Face symmetric, atraumatic.  Nose is slightly congested, TMs normal Lungs:  CTA B Normal respiratory effort, no intercostal retractions, no accessory muscle use. Heart: RRR,  no murmur.  No pretibial edema bilaterally  Abdomen:  Not distended, soft, non-tender. No rebound or rigidity.   Skin: Exposed areas without rash. Not pale. Not jaundice Neurologic:  alert & oriented X3.  Speech normal, gait appropriate for age and unassisted Strength symmetric and appropriate for age.  Psych: Cognition and judgment appear intact.  Cooperative with normal attention span and concentration.  Behavior appropriate. No anxious or depressed appearing.     Assessment     Assessment Prediabetes -- metformin intolerant, gi sx. Self d/c 2016 HTN Hyperlipidemia Reactive airway dz? Never rx a inhaler as off 01-2016 Thyroid: h/o autoimmune thyroiditis, goiter, Dr. Tobe Herman  Migraines  Dyspepsia -- on zantac Dry eyes-- sees eye doctor x 2/ year   PLAN: Prediabetes: Check A1c HTN: Seems controlled, recommend to check ambulatory BPs from time to time, refill losartan Hyperlipidemia: Ran out of Pravachol a while back, prescription sent, checking labs Autoimmune thyroiditis: Used to see Dr. Tobe Herman, has not seen in a while, thyroid exam negative today, check TFTs Last note from endocrinology 11-2016 I have been following the patient since 01/04/05 for the above issues. In the subsequent 12 years I've seen Wanda Herman in clinic about once every 6 months. She has had multiple flare-ups of Hashimoto's Thyroiditis over the years that have resulted in her being hyperthyroid (Hashitoxicosis), hypothyroid, and euthyroid at different times in between. Her TSH values have varied from 0.098-4.122. Free T4 values have varied from 0.60-1.23. Free T3 values have varied from 2.2-3.3. TPO antibody levels have always  been elevated between  52.2-113.3. Fortunately, she has been euthyroid most of the time, so I have never treated her with Synthroid.  Dyspepsia: Due to Zantac recall she stopped, will prescribe famotidine as needed RTC 1 year

## 2018-08-14 NOTE — Assessment & Plan Note (Signed)
--  Tdap:2014 ;  PNA shot: Never;  Shingles: Never ; had a flu shot @ work --Female care per Dr Mancel Bale, last OV ~ July 2019 --Diet and exercise : Active most days, room for improvement on diet.  Counseled. -Labs: CMP, FLP, CBC, A1c, TFTs

## 2018-08-14 NOTE — Assessment & Plan Note (Signed)
Prediabetes: Check A1c HTN: Seems controlled, recommend to check ambulatory BPs from time to time, refill losartan Hyperlipidemia: Ran out of Pravachol a while back, prescription sent, checking labs Autoimmune thyroiditis: Used to see Dr. Tobe Sos, has not seen in a while, thyroid exam negative today, check TFTs Last note from endocrinology 11-2016 I have been following the patient since 01/04/05 for the above issues. In the subsequent 12 years I've seen Wanda Herman in clinic about once every 6 months. She has had multiple flare-ups of Hashimoto's Thyroiditis over the years that have resulted in her being hyperthyroid (Hashitoxicosis), hypothyroid, and euthyroid at different times in between. Her TSH values have varied from 0.098-4.122. Free T4 values have varied from 0.60-1.23. Free T3 values have varied from 2.2-3.3. TPO antibody levels have always been elevated between  52.2-113.3. Fortunately, she has been euthyroid most of the time, so I have never treated her with Synthroid.  Dyspepsia: Due to Zantac recall she stopped, will prescribe famotidine as needed RTC 1 year

## 2018-08-17 ENCOUNTER — Ambulatory Visit
Admission: RE | Admit: 2018-08-17 | Discharge: 2018-08-17 | Disposition: A | Discharge status: Home / Self Care | Payer: BLUE CROSS/BLUE SHIELD | Source: Ambulatory Visit | Attending: Obstetrics and Gynecology | Admitting: Obstetrics and Gynecology

## 2018-08-17 DIAGNOSIS — N6011 Diffuse cystic mastopathy of right breast: Secondary | ICD-10-CM | POA: Diagnosis not present

## 2018-08-17 DIAGNOSIS — N644 Mastodynia: Secondary | ICD-10-CM

## 2018-08-17 DIAGNOSIS — N6310 Unspecified lump in the right breast, unspecified quadrant: Secondary | ICD-10-CM | POA: Diagnosis not present

## 2018-08-17 DIAGNOSIS — R922 Inconclusive mammogram: Secondary | ICD-10-CM | POA: Diagnosis not present

## 2018-08-17 MED ORDER — PRAVASTATIN SODIUM 20 MG PO TABS: 20.0000 mg | ORAL_TABLET | Freq: Every day | ORAL | 6 refills | Status: DC

## 2018-08-17 NOTE — Addendum Note (Signed)
Addended byDamita Dunnings D on: 08/17/2018 01:21 PM   Modules accepted: Orders

## 2018-11-02 ENCOUNTER — Other Ambulatory Visit: Payer: Self-pay

## 2018-11-02 MED ORDER — PRAVASTATIN SODIUM 20 MG PO TABS: 20.0000 mg | ORAL_TABLET | Freq: Every day | ORAL | 3 refills | Status: DC

## 2018-11-02 MED ORDER — FAMOTIDINE 40 MG PO TABS: 40.0000 mg | ORAL_TABLET | Freq: Every day | ORAL | 3 refills | Status: DC

## 2019-03-23 ENCOUNTER — Telehealth: Payer: Self-pay | Admitting: Internal Medicine

## 2019-03-23 NOTE — Telephone Encounter (Signed)
Few months ago, cholesterol medication was increased to Pravachol 20 mg, due for FLP, AST, ALT.  Please arrange

## 2019-03-23 NOTE — Telephone Encounter (Signed)
Mychart message sent.

## 2019-04-28 DIAGNOSIS — Z6829 Body mass index (BMI) 29.0-29.9, adult: Secondary | ICD-10-CM | POA: Diagnosis not present

## 2019-04-28 DIAGNOSIS — Z01419 Encounter for gynecological examination (general) (routine) without abnormal findings: Secondary | ICD-10-CM | POA: Diagnosis not present

## 2019-04-28 DIAGNOSIS — Z1231 Encounter for screening mammogram for malignant neoplasm of breast: Secondary | ICD-10-CM | POA: Diagnosis not present

## 2019-04-28 LAB — HM MAMMOGRAPHY

## 2019-05-10 ENCOUNTER — Encounter: Payer: Self-pay | Admitting: Internal Medicine

## 2019-06-05 DIAGNOSIS — Z23 Encounter for immunization: Secondary | ICD-10-CM | POA: Diagnosis not present

## 2019-08-16 ENCOUNTER — Other Ambulatory Visit: Payer: Self-pay

## 2019-08-16 ENCOUNTER — Ambulatory Visit (INDEPENDENT_AMBULATORY_CARE_PROVIDER_SITE_OTHER): Payer: BC Managed Care – PPO | Admitting: Internal Medicine

## 2019-08-16 ENCOUNTER — Encounter: Payer: Self-pay | Admitting: Internal Medicine

## 2019-08-16 VITALS — BP 125/77 | HR 69 | Temp 97.2°F | Resp 18 | Ht 63.0 in | Wt 166.2 lb

## 2019-08-16 DIAGNOSIS — R739 Hyperglycemia, unspecified: Secondary | ICD-10-CM | POA: Diagnosis not present

## 2019-08-16 DIAGNOSIS — Z0000 Encounter for general adult medical examination without abnormal findings: Secondary | ICD-10-CM

## 2019-08-16 DIAGNOSIS — Z09 Encounter for follow-up examination after completed treatment for conditions other than malignant neoplasm: Secondary | ICD-10-CM

## 2019-08-16 DIAGNOSIS — E063 Autoimmune thyroiditis: Secondary | ICD-10-CM | POA: Diagnosis not present

## 2019-08-16 LAB — LIPID PANEL
Cholesterol: 191 mg/dL (ref 0–200)
HDL: 63.6 mg/dL (ref >39.00)
LDL Cholesterol: 110 mg/dL — ABNORMAL HIGH (ref 0–99)
NonHDL: 127.48
Total CHOL/HDL Ratio: 3
Triglycerides: 85 mg/dL (ref 0.0–149.0)
VLDL: 17 mg/dL (ref 0.0–40.0)

## 2019-08-16 LAB — COMPREHENSIVE METABOLIC PANEL
ALT: 16 U/L (ref 0–35)
AST: 18 U/L (ref 0–37)
Albumin: 4.5 g/dL (ref 3.5–5.2)
Alkaline Phosphatase: 58 U/L (ref 39–117)
BUN: 12 mg/dL (ref 6–23)
CO2: 26 mEq/L (ref 19–32)
Calcium: 9.4 mg/dL (ref 8.4–10.5)
Chloride: 103 mEq/L (ref 96–112)
Creatinine, Ser: 0.83 mg/dL (ref 0.40–1.20)
GFR: 88.42 mL/min (ref >60.00)
Glucose, Bld: 116 mg/dL — ABNORMAL HIGH (ref 70–99)
Potassium: 4.2 mEq/L (ref 3.5–5.1)
Sodium: 137 mEq/L (ref 135–145)
Total Bilirubin: 0.4 mg/dL (ref 0.2–1.2)
Total Protein: 7 g/dL (ref 6.0–8.3)

## 2019-08-16 LAB — CBC WITH DIFFERENTIAL/PLATELET
Basophils Absolute: 0.1 10*3/uL (ref 0.0–0.1)
Basophils Relative: 1.5 % (ref 0.0–3.0)
Eosinophils Absolute: 0.3 10*3/uL (ref 0.0–0.7)
Eosinophils Relative: 5 % (ref 0.0–5.0)
HCT: 40.2 % (ref 36.0–46.0)
Hemoglobin: 13.6 g/dL (ref 12.0–15.0)
Lymphocytes Relative: 39.8 % (ref 12.0–46.0)
Lymphs Abs: 2.1 10*3/uL (ref 0.7–4.0)
MCHC: 33.7 g/dL (ref 30.0–36.0)
MCV: 91.3 fl (ref 78.0–100.0)
Monocytes Absolute: 0.4 10*3/uL (ref 0.1–1.0)
Monocytes Relative: 7.9 % (ref 3.0–12.0)
Neutro Abs: 2.4 10*3/uL (ref 1.4–7.7)
Neutrophils Relative %: 45.8 % (ref 43.0–77.0)
Platelets: 329 10*3/uL (ref 150.0–400.0)
RBC: 4.41 Mil/uL (ref 3.87–5.11)
RDW: 12.9 % (ref 11.5–15.5)
WBC: 5.2 10*3/uL (ref 4.0–10.5)

## 2019-08-16 LAB — HEMOGLOBIN A1C: Hgb A1c MFr Bld: 6.1 % (ref 4.6–6.5)

## 2019-08-16 LAB — T4, FREE: Free T4: 0.9 ng/dL (ref 0.60–1.60)

## 2019-08-16 LAB — TSH: TSH: 0.36 u[IU]/mL (ref 0.35–4.50)

## 2019-08-16 LAB — T3, FREE: T3, Free: 3.2 pg/mL (ref 2.3–4.2)

## 2019-08-16 NOTE — Progress Notes (Signed)
Subjective:    Patient ID: Wanda Herman, female    DOB: Mar 29, 1971, 48 y.o.   MRN: [id number]  DOS:  08/16/2019 Type of visit - description: CPX Has no major concerns Stress somewhat increased, she is working from home, daughter is doing  School virtually as well.  Review of Systems  Other than above, a 14 point review of systems is negative  Past Medical History:  Diagnosis Date  . Breast mass, left 07/08/06  . Breast mass, right 05/30/04  . Fibroid 08/21/2009  . Gestational diabetes 2005  . Goiter   . H/O candidiasis   . H/O varicella   . Headache(784.0)   . Hypertension   . Prediabetes 09/30/2014  . Thyroiditis, autoimmune     Past Surgical History:  Procedure Laterality Date  . ABDOMINAL HYSTERECTOMY  12/04/2011   HYSTERECTOMY ABDOMINAL; NO Oophorectomy---  Surgeon: Delice Lesch, MD;  Location: [facility];  Service: Gynecology;  Laterality: N/A;  . CYSTOSCOPY  12/04/2011   Procedure: CYSTOSCOPY;  Surgeon: Delice Lesch, MD;  Location: [facility];  Service: Gynecology;  Laterality: N/A;  . LASIK      Social History   Socioeconomic History  . Marital status: Married    Spouse name: Not on file  . Number of children: 2  . Years of education: Not on file  . Highest education level: Not on file  Occupational History  . Occupation: Aeronautical engineer: [employer]  Tobacco Use  . Smoking status: Never Smoker  . Smokeless tobacco: Never Used  Substance and Sexual Activity  . Alcohol use: No  . Drug use: No  . Sexual activity: Yes    Birth control/protection: Pill    Comment: tri-sprinted  Other Topics Concern  . Not on file  Social History Narrative   Lives w/ husband and two children : female 1999, 2005    Social Determinants of Health   Financial Resource Strain:   . Difficulty of Paying Living Expenses: Not on file  Food Insecurity:   . Worried About Charity fundraiser in the Last Year: Not on file  . Ran Out of Food in the Last Year:  Not on file  Transportation Needs:   . Lack of Transportation (Medical): Not on file  . Lack of Transportation (Non-Medical): Not on file  Physical Activity:   . Days of Exercise per Week: Not on file  . Minutes of Exercise per Session: Not on file  Stress:   . Feeling of Stress : Not on file  Social Connections:   . Frequency of Communication with Friends and Family: Not on file  . Frequency of Social Gatherings with Friends and Family: Not on file  . Attends Religious Services: Not on file  . Active Member of Clubs or Organizations: Not on file  . Attends Archivist Meetings: Not on file  . Marital Status: Not on file  Intimate Partner Violence:   . Fear of Current or Ex-Partner: Not on file  . Emotionally Abused: Not on file  . Physically Abused: Not on file  . Sexually Abused: Not on file     Family History  Problem Relation Age of Onset  . Diabetes Maternal Grandmother   . Hypertension Maternal Grandmother   . Ovarian cancer Maternal Aunt        ovarian cancer  . Diabetes Father   . Heart attack Other        GF at 48 y/o  .  Diabetes Mother   . Hypertension Mother   . Hyperlipidemia Mother   . Colon cancer Neg Hx   . Breast cancer Neg Hx      Allergies as of 08/16/2019   No Known Allergies     Medication List       Accurate as of August 16, 2019 11:59 PM. If you have any questions, ask your nurse or doctor.        azelastine 0.1 % nasal spray Commonly known as: ASTELIN Place 2 sprays into both nostrils at bedtime as needed for rhinitis.   cycloSPORINE 0.05 % ophthalmic emulsion Commonly known as: RESTASIS 1 drop 2 (two) times daily.   famotidine 40 MG tablet Commonly known as: Pepcid Take 1 tablet (40 mg total) by mouth daily.   losartan 25 MG tablet Commonly known as: COZAAR Take 1 tablet (25 mg total) by mouth daily.   pravastatin 20 MG tablet Commonly known as: PRAVACHOL Take 1 tablet (20 mg total) by mouth at bedtime.            Objective:   Physical Exam BP 125/77 (BP Location: Right Arm, Patient Position: Sitting, Cuff Size: Normal)   Pulse 69   Temp (!) 97.2 F (36.2 C) (Temporal)   Resp 18   Ht 5\' 3"  (1.6 m)   Wt 166 lb 3.2 oz (75.4 kg)   LMP 11/22/2011   SpO2 98%   BMI 29.44 kg/m  General: Well developed, NAD, BMI noted Neck: No  thyromegaly  HEENT:  Normocephalic . Face symmetric, atraumatic Lungs:  CTA B Normal respiratory effort, no intercostal retractions, no accessory muscle use. Heart: RRR,  no murmur.  No pretibial edema bilaterally  Abdomen:  Not distended, soft, non-tender. No rebound or rigidity.   Skin: Exposed areas without rash. Not pale. Not jaundice Neurologic:  alert & oriented X3.  Speech normal, gait appropriate for age and unassisted Strength symmetric and appropriate for age.  Psych: Cognition and judgment appear intact.  Cooperative with normal attention span and concentration.  Behavior appropriate. No anxious or depressed appearing.     Assessment    Assessment Prediabetes -- metformin intolerant, gi sx. Self d/c 2016 HTN Hyperlipidemia Reactive airway dz? Never rx a inhaler as off 01-2016 Thyroid: h/o autoimmune thyroiditis, goiter, Dr. Tobe Sos  Migraines  Dyspepsia -- on zantac Dry eyes-- sees eye doctor x 2/ year   PLAN: Here for CPX Prediabetes: Check A1c HTN: Continue losartan, ambulatory BPs, BP today very good, recommend ambulatory BPs High cholesterol: On Pravachol, checking labs, RF as needed Autoimmune thyroiditis: No symptoms, normal exam, checking TFTs RTC 1 year   This visit occurred during the SARS-CoV-2 public health emergency.  Safety protocols were in place, including screening questions prior to the visit, additional usage of staff PPE, and extensive cleaning of exam room while observing appropriate contact time as indicated for disinfecting solutions.

## 2019-08-16 NOTE — Patient Instructions (Signed)
GO TO THE LAB : Get the blood work     GO TO THE FRONT DESK Schedule your next appointment   for a physical exam in 1 year  Check the  blood pressure monthly BP GOAL is between 110/65 and  135/85. If it is consistently higher or lower, let me know

## 2019-08-16 NOTE — Assessment & Plan Note (Addendum)
--  Tdap:2014 ;  PNA shot: Never;  Shingles: Never ; had a flu shot --Female care per Dr Mancel Bale, last [place] 04/2019, had a MMG --Diet and exercise: Has been doing well during the summer, encouraged to find ways to stay active during the winter -Labs: CMP, FLP, CBC, A1c, TFTs

## 2019-08-17 NOTE — Assessment & Plan Note (Signed)
Here for CPX Prediabetes: Check A1c HTN: Continue losartan, ambulatory BPs, BP today very good, recommend ambulatory BPs High cholesterol: On Pravachol, checking labs, RF as needed Autoimmune thyroiditis: No symptoms, normal exam, checking TFTs RTC 1 year

## 2019-09-19 ENCOUNTER — Other Ambulatory Visit: Payer: Self-pay | Admitting: Internal Medicine

## 2019-11-04 ENCOUNTER — Other Ambulatory Visit: Payer: Self-pay | Admitting: Internal Medicine

## 2019-11-13 DIAGNOSIS — Z23 Encounter for immunization: Secondary | ICD-10-CM | POA: Diagnosis not present

## 2020-01-11 IMAGING — US ULTRASOUND RIGHT BREAST LIMITED
1 series · 6 of 6 positions shown · non-contrast
Comparison: Previous exam(s).

CLINICAL DATA: Patient presents with a 10 lump in the upper outer
right breast. She is a history of cysts in the lateral right breast,
evaluated on 11/03/2014.

EXAM:
DIGITAL DIAGNOSTIC RIGHT MAMMOGRAM WITH CAD AND TOMO
ULTRASOUND RIGHT BREAST

[Series 1: ultrasound right breast limited · 0.06mm/px · 6 of 6 slices shown]
[im 1/6]
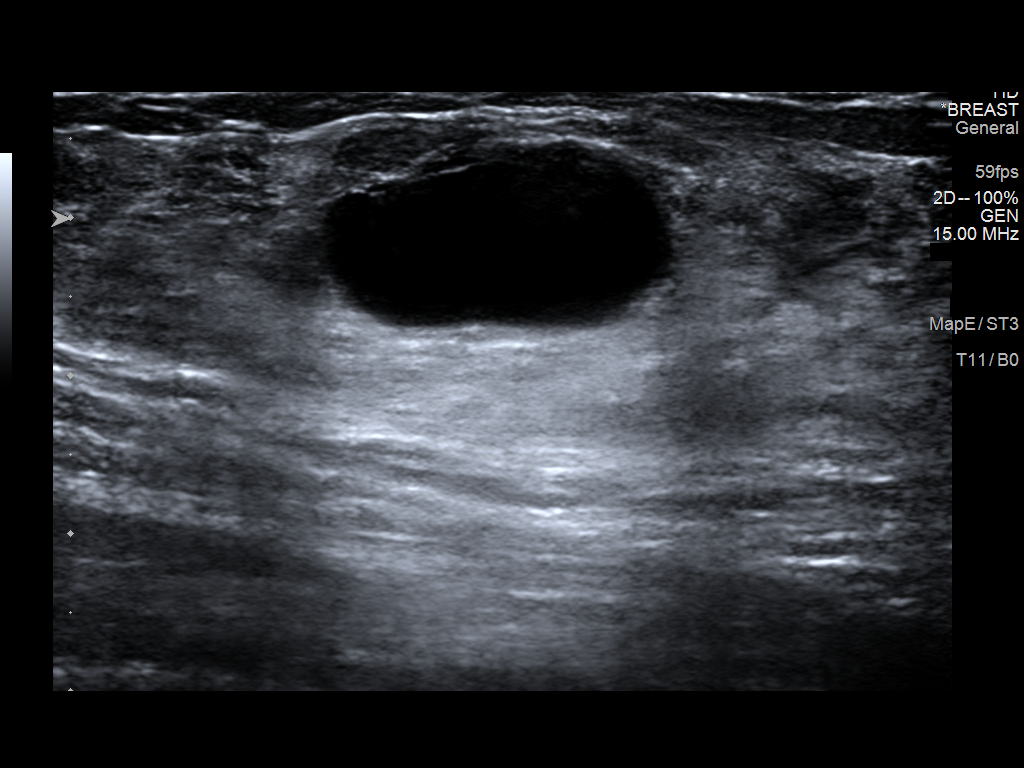
[im 2/6]
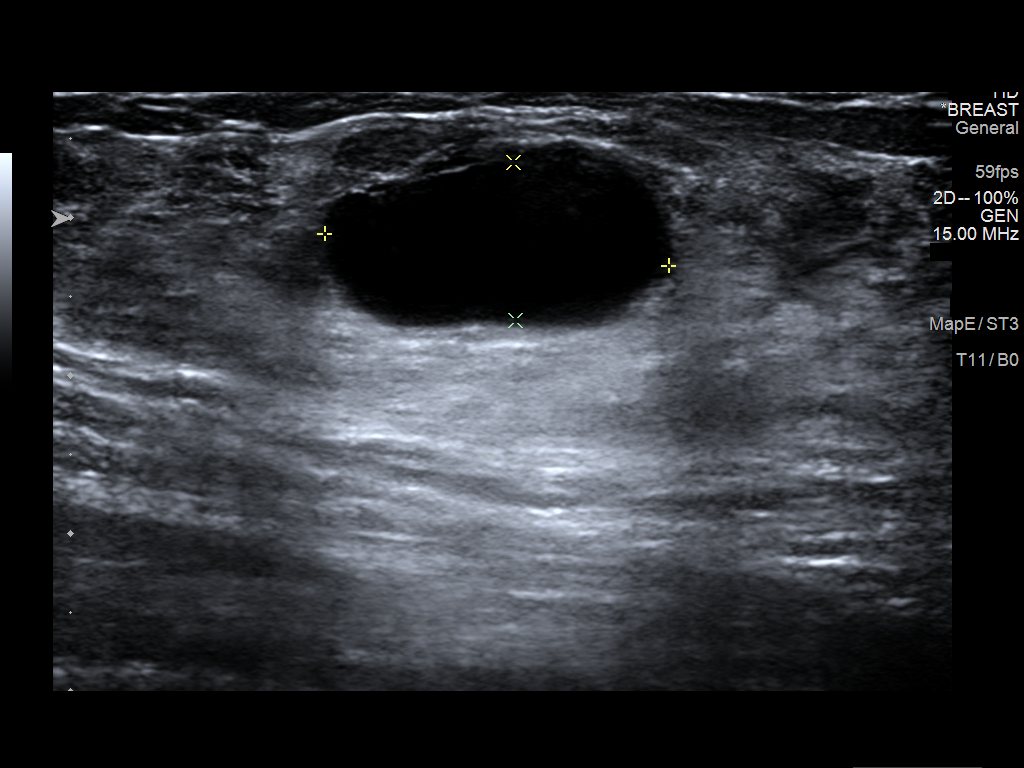
[im 3/6]
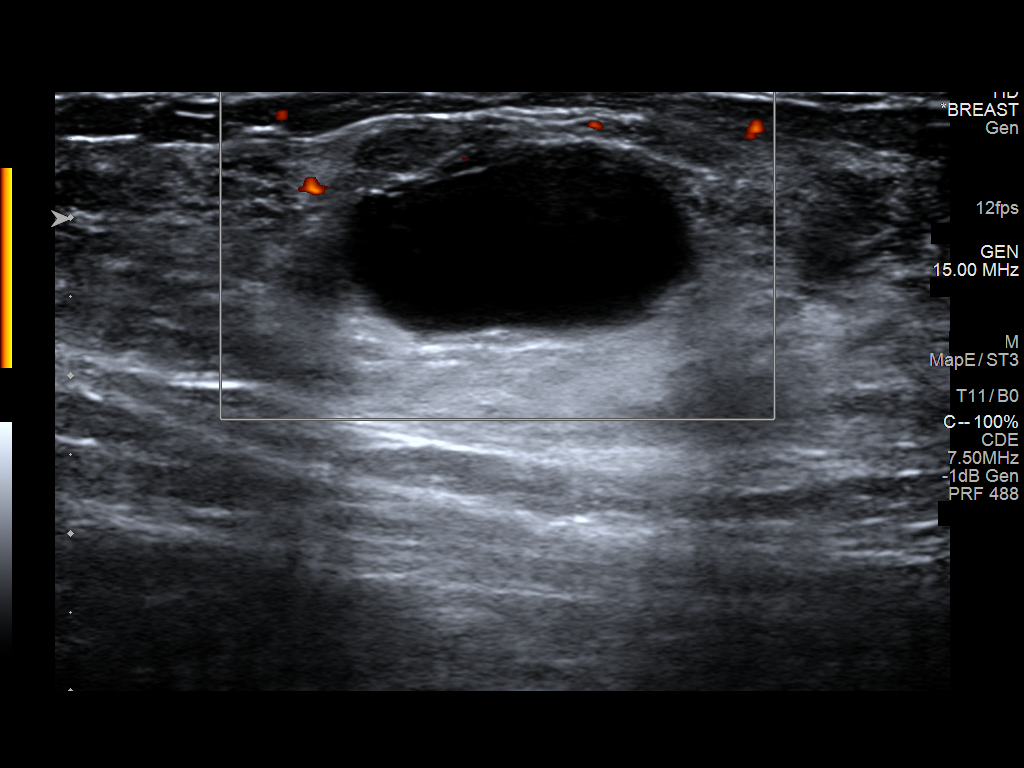
[im 4/6]
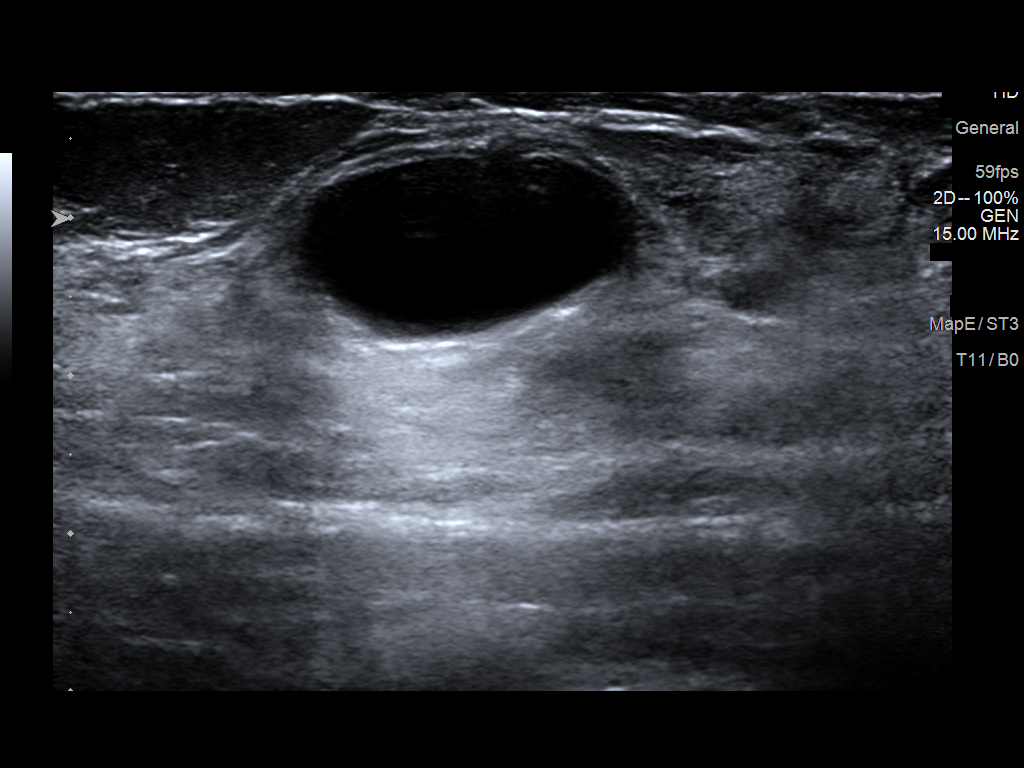
[im 5/6]
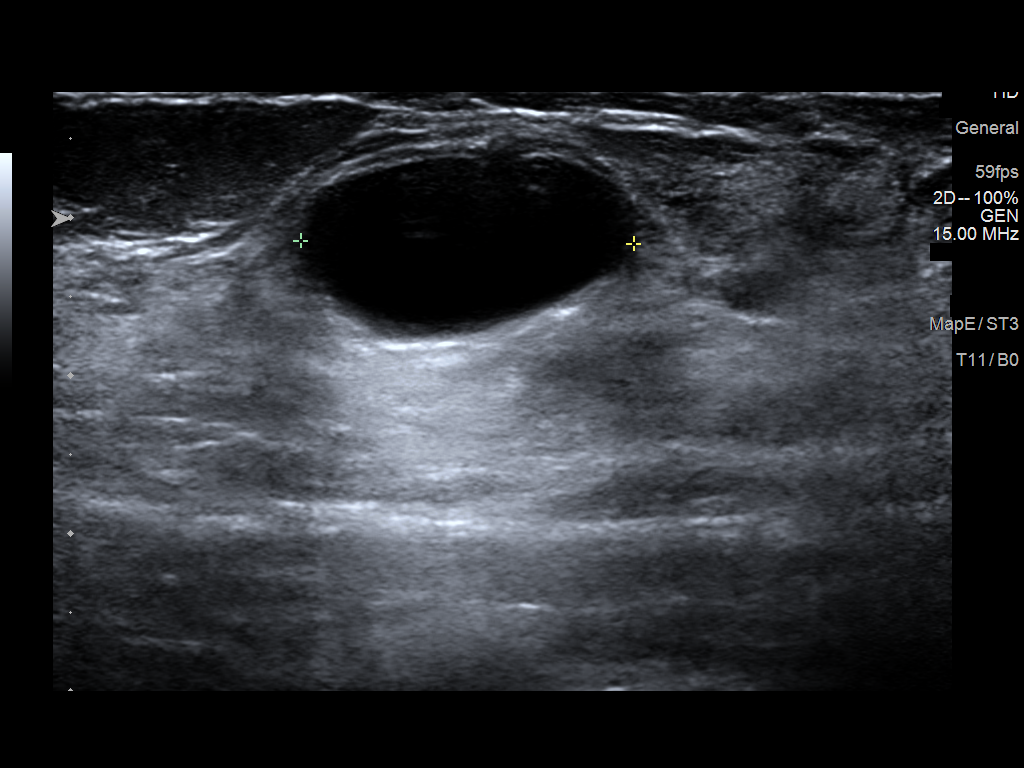
[im 6/6]
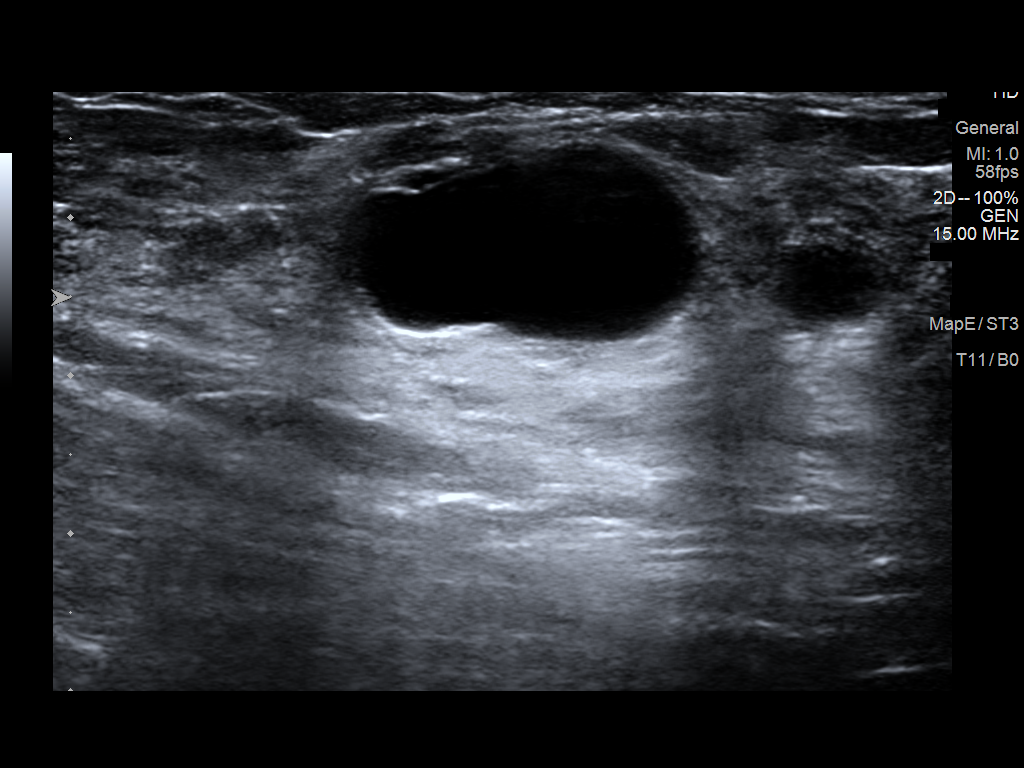

[6 of 6 positions shown; findings below may reference images not displayed]

ACR Breast Density Category d: The breast tissue is extremely dense,
which lowers the sensitivity of mammography.
FINDINGS: There partly circumscribed round to oval masses in the upper outer
right breast, the largest corresponding to the palpable abnormality.
There are no areas of architectural distortion and no suspicious
calcifications.

Mammographic images were processed with CAD.

On physical exam, there is a smooth ballotable mobile mass in the
right breast at 10 o'clock. It is mildly tender to palpation.

Targeted ultrasound is performed, showing a simple oval cyst in the
right breast at 10 o'clock, 5 cm the nipple, measuring 2.2 x 1.0 x
2.1 cm. This corresponds to the palpable abnormality. There are
multiple other cysts noted in the upper outer quadrant and lateral
right breast. No solid masses or suspicious lesions.
IMPRESSION: 1. No evidence of breast malignancy.
2. Benign cysts in the lateral and upper outer right breast.

RECOMMENDATION:
Screening mammogram in February 2019.(Code:RD-G-J20)

I have discussed the findings and recommendations with the patient.
Results were also provided in writing at the conclusion of the
visit. If applicable, a reminder letter will be sent to the patient
regarding the next appointment.

BI-RADS CATEGORY  2: Benign.

## 2020-01-11 IMAGING — MG DIGITAL DIAGNOSTIC UNILATERAL RIGHT MAMMOGRAM WITH TOMO AND CAD
6 series · 6 of 18 positions shown · non-contrast
Comparison: Previous exam(s).

CLINICAL DATA: Patient presents with a 10 lump in the upper outer
right breast. She is a history of cysts in the lateral right breast,
evaluated on 11/03/2014.

EXAM:
DIGITAL DIAGNOSTIC RIGHT MAMMOGRAM WITH CAD AND TOMO
ULTRASOUND RIGHT BREAST

[R MLO synth-2D]
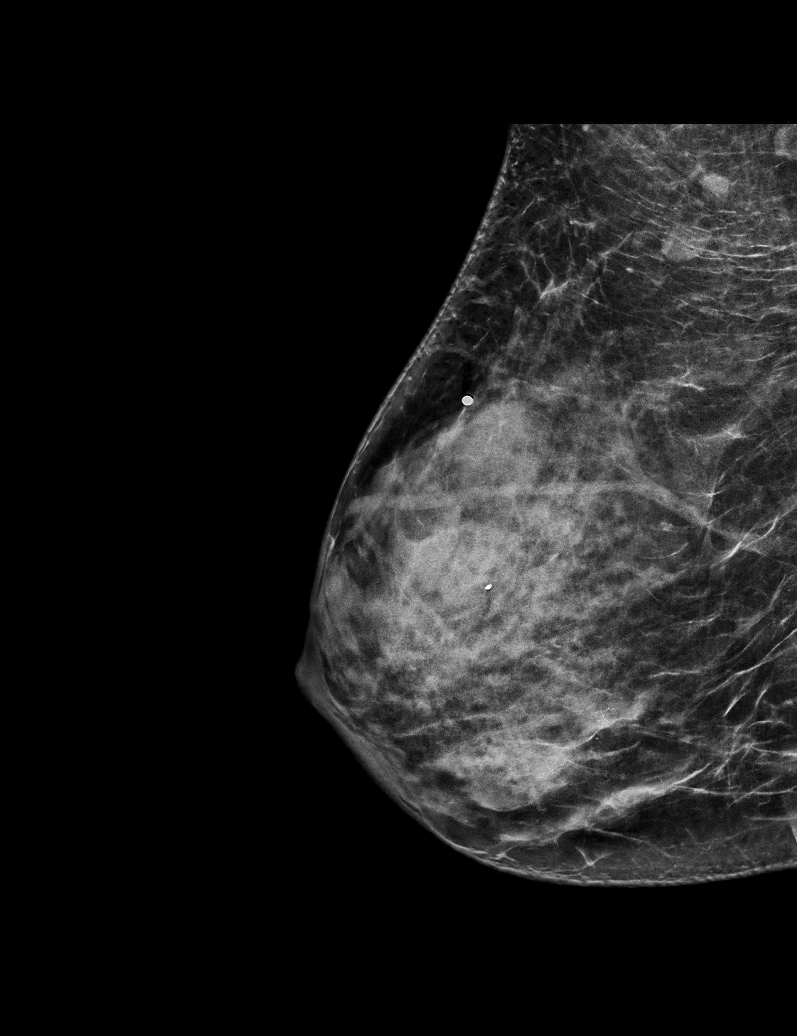

[R TAN synth-2D]
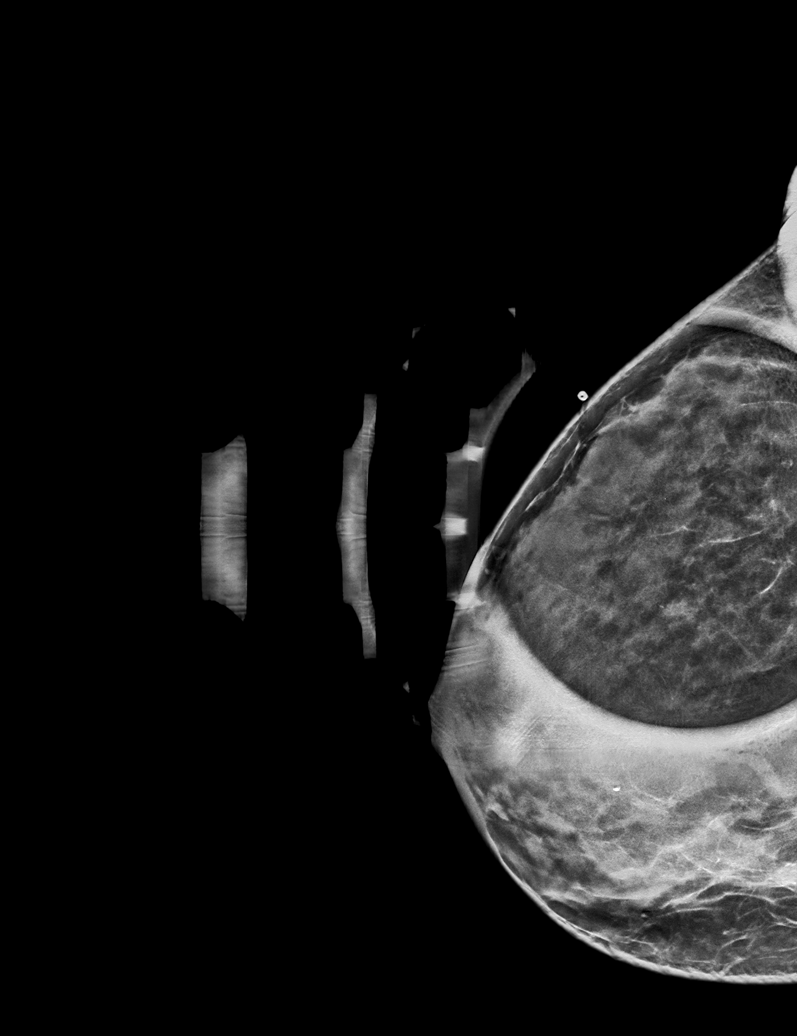

[R CC synth-2D]
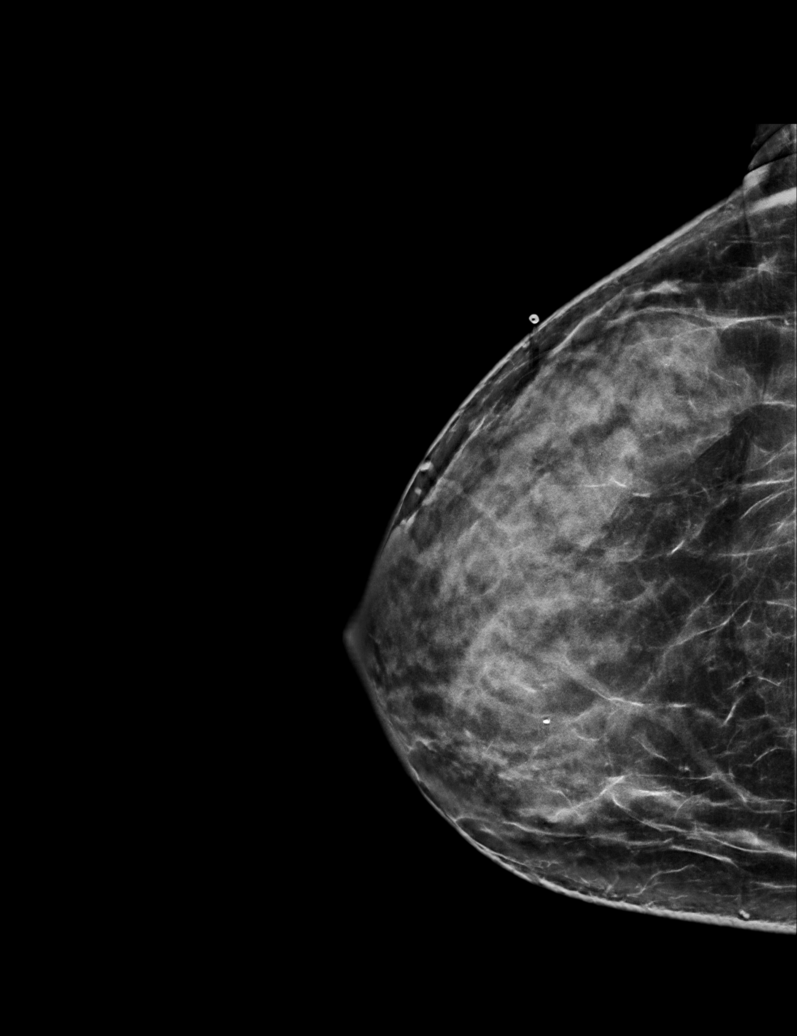

[R MLO tomo · tomo slice 33/65.0]
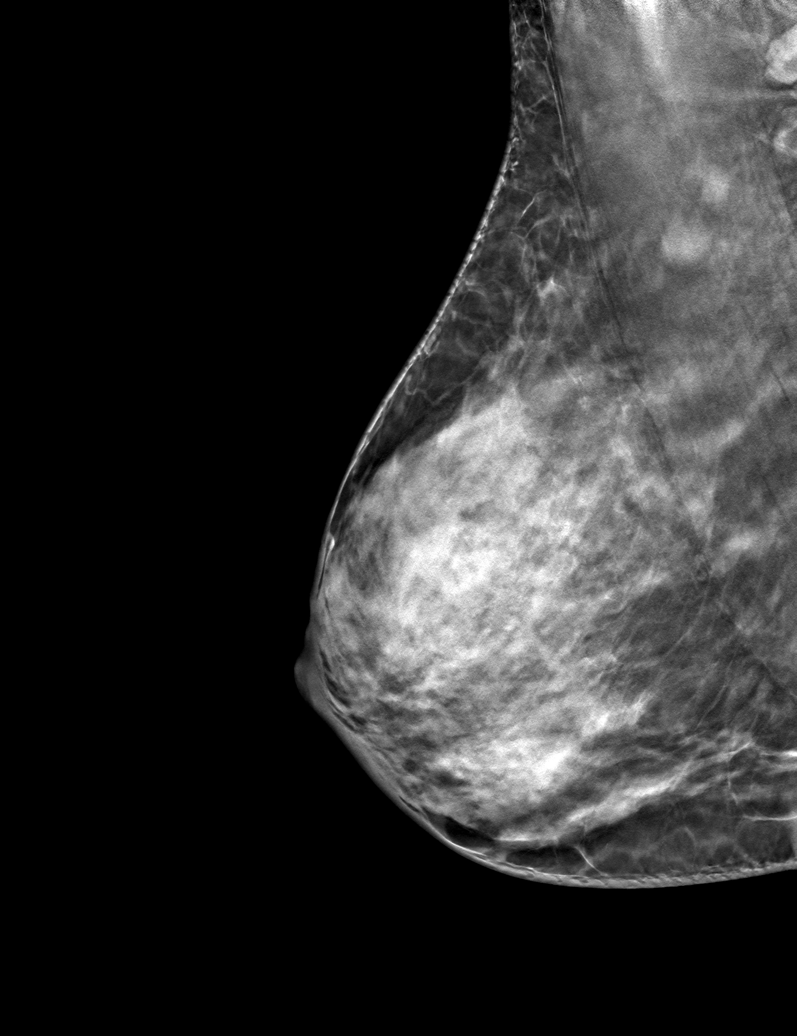

[R TAN tomo · tomo slice 29/57.0]
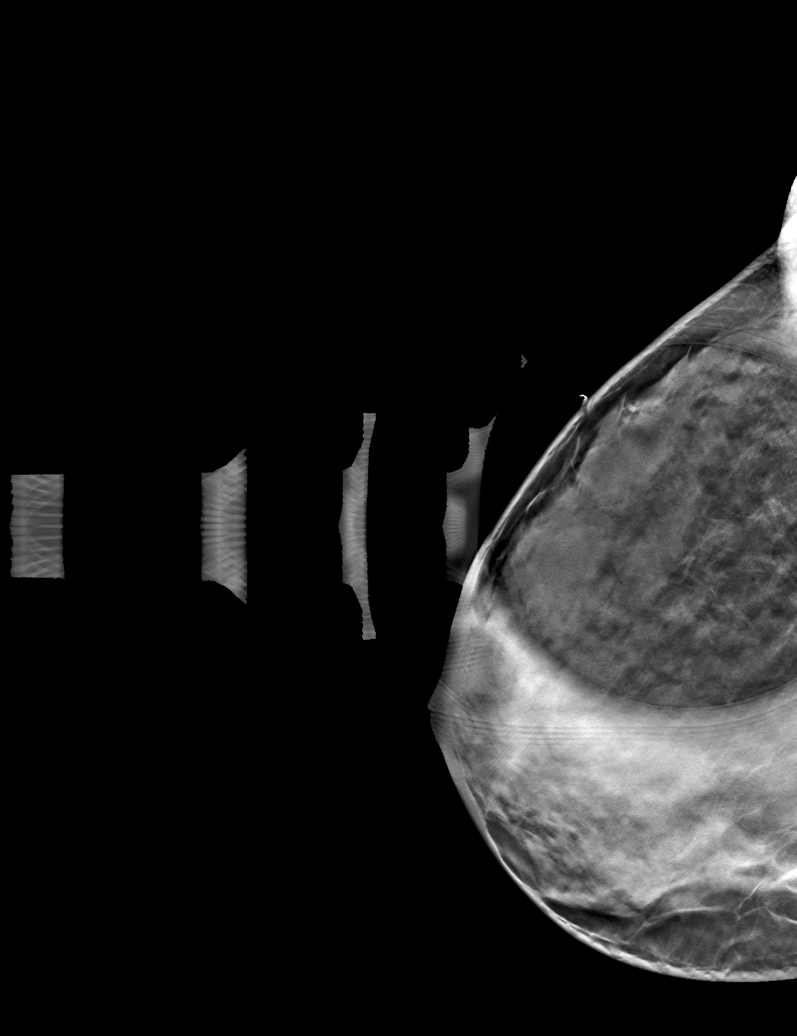

[R CC tomo · tomo slice 35/68.0]
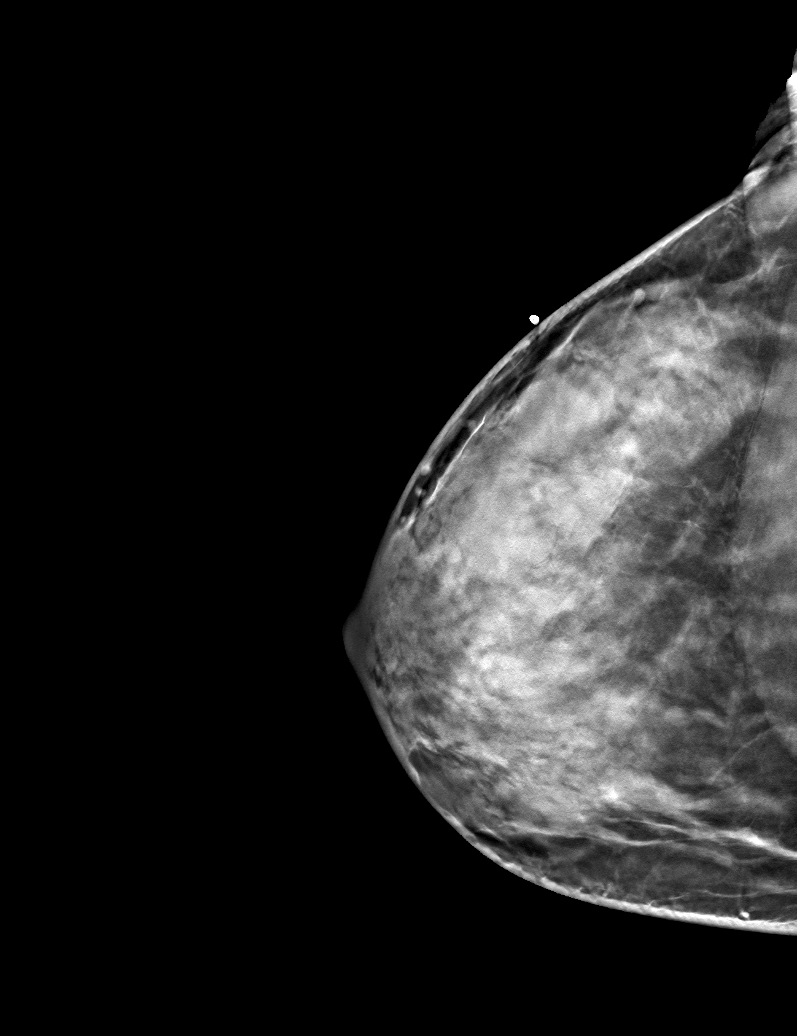

[6 of 18 positions shown; findings below may reference images not displayed]

ACR Breast Density Category d: The breast tissue is extremely dense,
which lowers the sensitivity of mammography.
FINDINGS: There partly circumscribed round to oval masses in the upper outer
right breast, the largest corresponding to the palpable abnormality.
There are no areas of architectural distortion and no suspicious
calcifications.

Mammographic images were processed with CAD.

On physical exam, there is a smooth ballotable mobile mass in the
right breast at 10 o'clock. It is mildly tender to palpation.

Targeted ultrasound is performed, showing a simple oval cyst in the
right breast at 10 o'clock, 5 cm the nipple, measuring 2.2 x 1.0 x
2.1 cm. This corresponds to the palpable abnormality. There are
multiple other cysts noted in the upper outer quadrant and lateral
right breast. No solid masses or suspicious lesions.
IMPRESSION: 1. No evidence of breast malignancy.
2. Benign cysts in the lateral and upper outer right breast.

RECOMMENDATION:
Screening mammogram in February 2019.(Code:RD-G-J20)

I have discussed the findings and recommendations with the patient.
Results were also provided in writing at the conclusion of the
visit. If applicable, a reminder letter will be sent to the patient
regarding the next appointment.

BI-RADS CATEGORY  2: Benign.

## 2020-04-07 DIAGNOSIS — H16143 Punctate keratitis, bilateral: Secondary | ICD-10-CM | POA: Diagnosis not present

## 2020-04-07 DIAGNOSIS — H16223 Keratoconjunctivitis sicca, not specified as Sjogren's, bilateral: Secondary | ICD-10-CM | POA: Diagnosis not present

## 2020-05-17 DIAGNOSIS — Z01419 Encounter for gynecological examination (general) (routine) without abnormal findings: Secondary | ICD-10-CM | POA: Diagnosis not present

## 2020-05-17 DIAGNOSIS — Z1211 Encounter for screening for malignant neoplasm of colon: Secondary | ICD-10-CM | POA: Diagnosis not present

## 2020-05-17 DIAGNOSIS — N6001 Solitary cyst of right breast: Secondary | ICD-10-CM | POA: Diagnosis not present

## 2020-05-17 DIAGNOSIS — Z1231 Encounter for screening mammogram for malignant neoplasm of breast: Secondary | ICD-10-CM | POA: Diagnosis not present

## 2020-06-05 ENCOUNTER — Other Ambulatory Visit: Payer: Self-pay | Admitting: Obstetrics and Gynecology

## 2020-06-05 DIAGNOSIS — N6489 Other specified disorders of breast: Secondary | ICD-10-CM

## 2020-06-15 DIAGNOSIS — Z1211 Encounter for screening for malignant neoplasm of colon: Secondary | ICD-10-CM | POA: Diagnosis not present

## 2020-06-15 DIAGNOSIS — E669 Obesity, unspecified: Secondary | ICD-10-CM | POA: Diagnosis not present

## 2020-06-15 DIAGNOSIS — K219 Gastro-esophageal reflux disease without esophagitis: Secondary | ICD-10-CM | POA: Diagnosis not present

## 2020-06-15 LAB — COMPREHENSIVE METABOLIC PANEL
Albumin: 4.7 (ref 3.5–5.0)
Calcium: 9.8 (ref 8.7–10.7)

## 2020-06-15 LAB — BASIC METABOLIC PANEL
BUN: 9 (ref 4–21)
CO2: 24 — AB (ref 13–22)
Chloride: 102 (ref 99–108)
Creatinine: 0.9 (ref 0.5–1.1)
Glucose: 112
Potassium: 4.6 (ref 3.4–5.3)
Sodium: 139 (ref 137–147)

## 2020-06-15 LAB — CBC AND DIFFERENTIAL
HCT: 40 (ref 36–46)
Hemoglobin: 13.6 (ref 12.0–16.0)
Platelets: 338 (ref 150–399)
WBC: 4.2

## 2020-06-15 LAB — CBC: RBC: 4.41 (ref 3.87–5.11)

## 2020-06-15 LAB — TSH: TSH: 2.71 (ref 0.41–5.90)

## 2020-06-16 ENCOUNTER — Encounter: Payer: Self-pay | Admitting: Internal Medicine

## 2020-06-16 LAB — HEPATIC FUNCTION PANEL
ALT: 14 (ref 7–35)
AST: 20 (ref 13–35)
Alkaline Phosphatase: 68 (ref 25–125)
Bilirubin, Direct: 0.13 (ref 0.01–0.4)
Bilirubin, Total: 0.5

## 2020-06-19 ENCOUNTER — Other Ambulatory Visit: Payer: Self-pay

## 2020-06-19 ENCOUNTER — Other Ambulatory Visit: Payer: Self-pay | Admitting: Obstetrics and Gynecology

## 2020-06-19 ENCOUNTER — Ambulatory Visit
Admission: RE | Admit: 2020-06-19 | Discharge: 2020-06-19 | Disposition: A | Discharge status: Home / Self Care | Payer: BC Managed Care – PPO | Source: Ambulatory Visit | Attending: Obstetrics and Gynecology | Admitting: Obstetrics and Gynecology

## 2020-06-19 DIAGNOSIS — N6489 Other specified disorders of breast: Secondary | ICD-10-CM

## 2020-06-19 DIAGNOSIS — N6002 Solitary cyst of left breast: Secondary | ICD-10-CM | POA: Diagnosis not present

## 2020-06-19 DIAGNOSIS — R922 Inconclusive mammogram: Secondary | ICD-10-CM | POA: Diagnosis not present

## 2020-06-19 DIAGNOSIS — N6310 Unspecified lump in the right breast, unspecified quadrant: Secondary | ICD-10-CM

## 2020-06-23 ENCOUNTER — Ambulatory Visit
Admission: RE | Admit: 2020-06-23 | Discharge: 2020-06-23 | Disposition: A | Discharge status: Home / Self Care | Payer: BC Managed Care – PPO | Source: Ambulatory Visit | Attending: Obstetrics and Gynecology | Admitting: Obstetrics and Gynecology

## 2020-06-23 ENCOUNTER — Other Ambulatory Visit: Payer: Self-pay | Admitting: Obstetrics and Gynecology

## 2020-06-23 ENCOUNTER — Other Ambulatory Visit: Payer: Self-pay

## 2020-06-23 DIAGNOSIS — N6310 Unspecified lump in the right breast, unspecified quadrant: Secondary | ICD-10-CM

## 2020-06-23 DIAGNOSIS — N6011 Diffuse cystic mastopathy of right breast: Secondary | ICD-10-CM | POA: Diagnosis not present

## 2020-06-23 DIAGNOSIS — R922 Inconclusive mammogram: Secondary | ICD-10-CM | POA: Diagnosis not present

## 2020-07-17 ENCOUNTER — Encounter: Payer: Self-pay | Admitting: Family Medicine

## 2020-07-17 ENCOUNTER — Other Ambulatory Visit: Payer: Self-pay

## 2020-07-17 ENCOUNTER — Ambulatory Visit (INDEPENDENT_AMBULATORY_CARE_PROVIDER_SITE_OTHER): Payer: BC Managed Care – PPO | Admitting: Family Medicine

## 2020-07-17 VITALS — BP 134/85 | HR 66 | Ht 63.0 in | Wt 168.0 lb

## 2020-07-17 DIAGNOSIS — M222X1 Patellofemoral disorders, right knee: Secondary | ICD-10-CM | POA: Diagnosis not present

## 2020-07-17 DIAGNOSIS — M25551 Pain in right hip: Secondary | ICD-10-CM | POA: Diagnosis not present

## 2020-07-17 MED ORDER — PENNSAID 2 % EX SOLN: 1.0000 "application " | Freq: Two times a day (BID) | CUTANEOUS | 2 refills | Status: DC

## 2020-07-17 MED ORDER — PREDNISONE 5 MG PO TABS: ORAL_TABLET | ORAL | 0 refills | Status: DC

## 2020-07-17 NOTE — Progress Notes (Signed)
Wanda Herman - 49 y.o. female MRN [id number]  Date of birth: 08-14-1971  SUBJECTIVE:  Including CC & ROS.  Chief Complaint  Patient presents with  . Hip Pain    right  . Knee Pain    right    Wanda Herman is a 49 y.o. female that is presenting with right hip pain and right knee pain.  The hip pain has been ongoing since last year.  Is occurring over the lateral aspect of the hip.  It is intermittent in nature.  She notices it with certain movements.  The right knee pain is occurring over the past 3 months.  Is localized to the knee.  Occurring over the anterior lateral aspect of the knee.  No history of surgery.  Has tried ibuprofen intermittently.  No history of injury.   Review of Systems See HPI   HISTORY: Past Medical, Surgical, Social, and Family History Reviewed & Updated per EMR.   Pertinent Historical Findings include:  Past Medical History:  Diagnosis Date  . Breast mass, left 07/08/06  . Breast mass, right 05/30/04  . Fibroid 08/21/2009  . Gestational diabetes 2005  . Goiter   . H/O candidiasis   . H/O varicella   . Headache(784.0)   . Hypertension   . Prediabetes 09/30/2014  . Thyroiditis, autoimmune     Past Surgical History:  Procedure Laterality Date  . ABDOMINAL HYSTERECTOMY  12/04/2011   HYSTERECTOMY ABDOMINAL; NO Oophorectomy---  Surgeon: Delice Lesch, MD;  Location: [facility];  Service: Gynecology;  Laterality: N/A;  . CYSTOSCOPY  12/04/2011   Procedure: CYSTOSCOPY;  Surgeon: Delice Lesch, MD;  Location: [facility];  Service: Gynecology;  Laterality: N/A;  . LASIK      Family History  Problem Relation Age of Onset  . Diabetes Maternal Grandmother   . Hypertension Maternal Grandmother   . Ovarian cancer Maternal Aunt        ovarian cancer  . Diabetes Father   . Heart attack Other        GF at 49 y/o  . Diabetes Mother   . Hypertension Mother   . Hyperlipidemia Mother   . Colon cancer Neg Hx   . Breast cancer Neg Hx     Social History    Socioeconomic History  . Marital status: Married    Spouse name: Not on file  . Number of children: 2  . Years of education: Not on file  . Highest education level: Not on file  Occupational History  . Occupation: Aeronautical engineer: [employer]  Tobacco Use  . Smoking status: Never Smoker  . Smokeless tobacco: Never Used  Substance and Sexual Activity  . Alcohol use: No  . Drug use: No  . Sexual activity: Yes    Birth control/protection: Pill    Comment: tri-sprinted  Other Topics Concern  . Not on file  Social History Narrative   Lives w/ husband and two children : female 1999, 2005    Social Determinants of Health   Financial Resource Strain:   . Difficulty of Paying Living Expenses: Not on file  Food Insecurity:   . Worried About Charity fundraiser in the Last Year: Not on file  . Ran Out of Food in the Last Year: Not on file  Transportation Needs:   . Lack of Transportation (Medical): Not on file  . Lack of Transportation (Non-Medical): Not on file  Physical Activity:   . Days of Exercise  per Week: Not on file  . Minutes of Exercise per Session: Not on file  Stress:   . Feeling of Stress : Not on file  Social Connections:   . Frequency of Communication with Friends and Family: Not on file  . Frequency of Social Gatherings with Friends and Family: Not on file  . Attends Religious Services: Not on file  . Active Member of Clubs or Organizations: Not on file  . Attends Archivist Meetings: Not on file  . Marital Status: Not on file  Intimate Partner Violence:   . Fear of Current or Ex-Partner: Not on file  . Emotionally Abused: Not on file  . Physically Abused: Not on file  . Sexually Abused: Not on file     PHYSICAL EXAM:  VS: BP 134/85   Pulse 66   Ht 5\' 3"  (1.6 m)   Wt 168 lb (76.2 kg)   LMP 11/22/2011   BMI 29.76 kg/m  Physical Exam Gen: NAD, alert, cooperative with exam, well-appearing MSK:  Right hip: Tenderness to  palpation of the greater trochanter. Normal internal and external rotation of the hip. Normal strength with hip flexion. Weakness with hip abduction. No tenderness to palpation over the SI joint.  Right knee: No effusion. Normal strength resistance. Negative Noble's test. Tenderness palpation over the lateral joint space. Neurovascularly intact     ASSESSMENT & PLAN:   Greater trochanteric pain syndrome of right lower extremity Has pain over the greater trochanteric has hip abduction weakness on exam -Counseled on home exercise therapy and supportive care. -Prednisone. -Could consider imaging, injection or physical therapy.  Patellofemoral pain syndrome of right knee Knee pain occurred secondary to the hip pain.  Has hip abduction weakness likely leading to the pain. -Counseled on home exercise therapy and supportive care. -Pennsaid. -Could consider physical therapy.

## 2020-07-17 NOTE — Patient Instructions (Signed)
Nice to meet you Please alternate heat and ice  Please try the exercises  Please try the prednisone  You can use the pennsaid after the prednisone   Please send me a message in MyChart with any questions or updates.  Please see me back in 4 weeks.   --Dr. Raeford Razor

## 2020-07-17 NOTE — Assessment & Plan Note (Signed)
Has pain over the greater trochanteric has hip abduction weakness on exam -Counseled on home exercise therapy and supportive care. -Prednisone. -Could consider imaging, injection or physical therapy.

## 2020-07-17 NOTE — Assessment & Plan Note (Signed)
Knee pain occurred secondary to the hip pain.  Has hip abduction weakness likely leading to the pain. -Counseled on home exercise therapy and supportive care. -Pennsaid. -Could consider physical therapy.

## 2020-07-26 ENCOUNTER — Telehealth: Payer: Self-pay | Admitting: Internal Medicine

## 2020-07-26 NOTE — Telephone Encounter (Signed)
Caller: Ainsley Call back: [phone]  Patient would like more information on Covid booster   Pt states had J&J vaccine in Feb  Wants to know what booster to choose/if one is more recommended.  Pt states you can leave detailed message if no answer.

## 2020-08-04 DIAGNOSIS — Z1211 Encounter for screening for malignant neoplasm of colon: Secondary | ICD-10-CM | POA: Diagnosis not present

## 2020-08-04 LAB — HM COLONOSCOPY

## 2020-08-07 ENCOUNTER — Encounter: Payer: Self-pay | Admitting: Internal Medicine

## 2020-08-07 DIAGNOSIS — L821 Other seborrheic keratosis: Secondary | ICD-10-CM | POA: Diagnosis not present

## 2020-08-07 DIAGNOSIS — D2271 Melanocytic nevi of right lower limb, including hip: Secondary | ICD-10-CM | POA: Diagnosis not present

## 2020-08-07 DIAGNOSIS — D225 Melanocytic nevi of trunk: Secondary | ICD-10-CM | POA: Diagnosis not present

## 2020-08-07 DIAGNOSIS — D1723 Benign lipomatous neoplasm of skin and subcutaneous tissue of right leg: Secondary | ICD-10-CM | POA: Diagnosis not present

## 2020-08-16 ENCOUNTER — Ambulatory Visit (INDEPENDENT_AMBULATORY_CARE_PROVIDER_SITE_OTHER): Payer: BC Managed Care – PPO | Admitting: Family Medicine

## 2020-08-16 ENCOUNTER — Encounter: Payer: Self-pay | Admitting: Internal Medicine

## 2020-08-16 ENCOUNTER — Ambulatory Visit: Payer: BC Managed Care – PPO | Admitting: Internal Medicine

## 2020-08-16 ENCOUNTER — Other Ambulatory Visit: Payer: Self-pay

## 2020-08-16 DIAGNOSIS — M25551 Pain in right hip: Secondary | ICD-10-CM

## 2020-08-16 DIAGNOSIS — M222X1 Patellofemoral disorders, right knee: Secondary | ICD-10-CM

## 2020-08-16 DIAGNOSIS — Z0289 Encounter for other administrative examinations: Secondary | ICD-10-CM

## 2020-08-16 NOTE — Assessment & Plan Note (Signed)
Significant improvement with medication or exercises. -Counseled on home exercise therapy and supportive care. -Could consider physical therapy if needed.

## 2020-08-16 NOTE — Assessment & Plan Note (Signed)
Improvement with strengthening of the pelvis and hip. -Counseled on home exercise therapy and supportive care. -Could consider physical therapy if needed.

## 2020-08-16 NOTE — Progress Notes (Signed)
NUMA HEATWOLE - 49 y.o. female MRN [id number]  Date of birth: [birth year]  SUBJECTIVE:  Including CC & ROS.  No chief complaint on file.   AUNDREYA SOUFFRANT is a 49 y.o. female that is following up for her hip and knee pain.  She has got significant improvement with exercise as well as medication.  She denies any pain that limits her ability.   Review of Systems See HPI   HISTORY: Past Medical, Surgical, Social, and Family History Reviewed & Updated per EMR.   Pertinent Historical Findings include:  Past Medical History:  Diagnosis Date  . Breast mass, left 07/08/06  . Breast mass, right 05/30/04  . Fibroid 08/21/2009  . Gestational diabetes 2005  . Goiter   . H/O candidiasis   . H/O varicella   . Headache(784.0)   . Hypertension   . Prediabetes 09/30/2014  . Thyroiditis, autoimmune     Past Surgical History:  Procedure Laterality Date  . ABDOMINAL HYSTERECTOMY  12/04/2011   HYSTERECTOMY ABDOMINAL; NO Oophorectomy---  Surgeon: Purcell Nails, MD;  Location: [facility];  Service: Gynecology;  Laterality: N/A;  . CYSTOSCOPY  12/04/2011   Procedure: CYSTOSCOPY;  Surgeon: Purcell Nails, MD;  Location: [facility];  Service: Gynecology;  Laterality: N/A;  . LASIK      Family History  Problem Relation Age of Onset  . Diabetes Maternal Grandmother   . Hypertension Maternal Grandmother   . Ovarian cancer Maternal Aunt        ovarian cancer  . Diabetes Father   . Heart attack Other        GF at 49 y/o  . Diabetes Mother   . Hypertension Mother   . Hyperlipidemia Mother   . Colon cancer Neg Hx   . Breast cancer Neg Hx     Social History   Socioeconomic History  . Marital status: Married    Spouse name: Not on file  . Number of children: 2  . Years of education: Not on file  . Highest education level: Not on file  Occupational History  . Occupation: Agricultural engineer: [employer]  Tobacco Use  . Smoking status: Never Smoker  . Smokeless tobacco:  Never Used  Substance and Sexual Activity  . Alcohol use: No  . Drug use: No  . Sexual activity: Yes    Birth control/protection: Pill    Comment: tri-sprinted  Other Topics Concern  . Not on file  Social History Narrative   Lives w/ husband and two children : female 1999, 41    Social Determinants of Corporate investment banker Strain: Not on [employer] Insecurity: Not on file  Transportation Needs: Not on file  Physical Activity: Not on file  Stress: Not on file  Social Connections: Not on file  Intimate Partner Violence: Not on file     PHYSICAL EXAM:  VS: BP 111/78   Ht 5\' 3"  (1.6 m)   Wt 168 lb (76.2 kg)   LMP 11/22/2011   BMI 29.76 kg/m  Physical Exam Gen: NAD, alert, cooperative with exam, well-appearing   ASSESSMENT & PLAN:   Greater trochanteric pain syndrome of right lower extremity Significant improvement with medication or exercises. -Counseled on home exercise therapy and supportive care. -Could consider physical therapy if needed.  Patellofemoral pain syndrome of right knee Improvement with strengthening of the pelvis and hip. -Counseled on home exercise therapy and supportive care. -Could consider physical therapy if needed.

## 2020-09-07 ENCOUNTER — Telehealth: Payer: Self-pay | Admitting: Internal Medicine

## 2020-09-07 NOTE — Telephone Encounter (Signed)
Patient would  like for kaylyn to give her call  [phone]

## 2020-09-07 NOTE — Telephone Encounter (Signed)
Need further information. What is the call regarding?

## 2020-09-11 ENCOUNTER — Encounter: Payer: Self-pay | Admitting: Internal Medicine

## 2020-09-11 ENCOUNTER — Ambulatory Visit (INDEPENDENT_AMBULATORY_CARE_PROVIDER_SITE_OTHER): Payer: BC Managed Care – PPO | Admitting: Internal Medicine

## 2020-09-11 ENCOUNTER — Other Ambulatory Visit: Payer: Self-pay

## 2020-09-11 VITALS — BP 137/91 | HR 74 | Temp 98.2°F | Resp 16 | Ht 63.0 in | Wt 175.0 lb

## 2020-09-11 DIAGNOSIS — R739 Hyperglycemia, unspecified: Secondary | ICD-10-CM | POA: Diagnosis not present

## 2020-09-11 DIAGNOSIS — E079 Disorder of thyroid, unspecified: Secondary | ICD-10-CM | POA: Diagnosis not present

## 2020-09-11 DIAGNOSIS — Z1159 Encounter for screening for other viral diseases: Secondary | ICD-10-CM | POA: Diagnosis not present

## 2020-09-11 DIAGNOSIS — E063 Autoimmune thyroiditis: Secondary | ICD-10-CM | POA: Diagnosis not present

## 2020-09-11 DIAGNOSIS — Z0000 Encounter for general adult medical examination without abnormal findings: Secondary | ICD-10-CM | POA: Diagnosis not present

## 2020-09-11 NOTE — Progress Notes (Signed)
Pre visit review using our clinic review tool, if applicable. No additional management support is needed unless otherwise documented below in the visit note. 

## 2020-09-11 NOTE — Progress Notes (Signed)
Subjective:    Patient ID: Wanda Herman, female    DOB: [birth year], 50 y.o.   MRN: [id number]  DOS:  09/11/2020 Type of visit - description: CPX Since the last office visit she is doing well and has no major concerns except difficulties losing weight. She did have some hip problems but that is largely resolved.  BP Readings from Last 3 Encounters:  09/11/20 (!) 137/91  08/16/20 111/78  07/17/20 134/85    Wt Readings from Last 3 Encounters:  09/11/20 175 lb (79.4 kg)  08/16/20 168 lb (76.2 kg)  07/17/20 168 lb (76.2 kg)     Review of Systems  Other than above, a 14 point review of systems is negative      Past Medical History:  Diagnosis Date  . Breast mass, left 07/08/06  . Breast mass, right 05/30/04  . Fibroid 08/21/2009  . Gestational diabetes 2005  . Goiter   . H/O candidiasis   . H/O varicella   . Headache(784.0)   . Hypertension   . Prediabetes 09/30/2014  . Thyroiditis, autoimmune     Past Surgical History:  Procedure Laterality Date  . ABDOMINAL HYSTERECTOMY  12/04/2011   HYSTERECTOMY ABDOMINAL; NO Oophorectomy---  Surgeon: Delice Lesch, MD;  Location: [facility];  Service: Gynecology;  Laterality: N/A;  . CYSTOSCOPY  12/04/2011   Procedure: CYSTOSCOPY;  Surgeon: Delice Lesch, MD;  Location: [facility];  Service: Gynecology;  Laterality: N/A;  . LASIK      Allergies as of 09/11/2020   No Known Allergies     Medication List       Accurate as of September 11, 2020 11:59 PM. If you have any questions, ask your nurse or doctor.        STOP taking these medications   Pennsaid 2 % Soln Generic drug: Diclofenac Sodium Stopped by: Kathlene November, MD   predniSONE 5 MG tablet Commonly known as: DELTASONE Stopped by: Kathlene November, MD     TAKE these medications   azelastine 0.1 % nasal spray Commonly known as: ASTELIN Place 2 sprays into both nostrils at bedtime as needed for rhinitis.   cycloSPORINE 0.05 % ophthalmic emulsion Commonly known as:  RESTASIS 1 drop 2 (two) times daily.   famotidine 40 MG tablet Commonly known as: PEPCID Take 1 tablet (40 mg total) by mouth daily.   losartan 25 MG tablet Commonly known as: COZAAR Take 1 tablet (25 mg total) by mouth daily.   pravastatin 20 MG tablet Commonly known as: PRAVACHOL Take 1 tablet (20 mg total) by mouth at bedtime.          Objective:   Physical Exam BP (!) 137/91 (BP Location: Left Arm, Patient Position: Sitting, Cuff Size: Small)   Pulse 74   Temp 98.2 F (36.8 C) (Oral)   Resp 16   Ht 5\' 3"  (1.6 m)   Wt 175 lb (79.4 kg)   LMP 11/22/2011   SpO2 96%   BMI 31.00 kg/m  General: Well developed, NAD, BMI noted Neck: No  thyromegaly  HEENT:  Normocephalic . Face symmetric, atraumatic Lungs:  CTA B Normal respiratory effort, no intercostal retractions, no accessory muscle use. Heart: RRR,  no murmur.  Abdomen:  Not distended, soft, non-tender. No rebound or rigidity.   Lower extremities: no pretibial edema bilaterally  Skin: Exposed areas without rash. Not pale. Not jaundice Neurologic:  alert & oriented X3.  Speech normal, gait appropriate for age and unassisted Strength symmetric and appropriate  for age.  Psych: Cognition and judgment appear intact.  Cooperative with normal attention span and concentration.  Behavior appropriate. No anxious or depressed appearing.     Assessment      Assessment Prediabetes -- metformin intolerant, gi sx. Self d/c 2016 HTN Hyperlipidemia Reactive airway dz? Never rx a inhaler as off 01-2016 Thyroid: h/o autoimmune thyroiditis, goiter, Dr. Tobe Sos  Migraines  Dyspepsia -- on zantac Dry eyes-- sees eye doctor x 2/ year   PLAN: Here for CPX Prediabetes: Checking A1c, doing well with lifestyle although she has gained few pounds. HTN: Diastolic BP today slightly elevated, typically okay, recent BMP normal, continue losartan. Hyperlipidemia: Recent LFTs normal, on Pravachol, check FLP. Autoimmune  thyroiditis, goiter: Due to location would like to see another provider, refer to Dr Kelton Pillar, who comes to this office. RTC 6 months to reassess pre-diabetes and blood pressure       This visit occurred during the SARS-CoV-2 public health emergency.  Safety protocols were in place, including screening questions prior to the visit, additional usage of staff PPE, and extensive cleaning of exam room while observing appropriate contact time as indicated for disinfecting solutions.

## 2020-09-11 NOTE — Patient Instructions (Addendum)
If your Shingrix is covered by your insurance and you like to proceed, you can do so at your pharmacy or here.   GO TO THE LAB : Get the blood work     [place], [place] back for   a checkup in 6 months

## 2020-09-12 ENCOUNTER — Encounter: Payer: Self-pay | Admitting: Internal Medicine

## 2020-09-12 LAB — HEPATITIS C ANTIBODY
Hepatitis C Ab: NONREACTIVE
SIGNAL TO CUT-OFF: 0.01 (ref <1.00)

## 2020-09-12 LAB — LIPID PANEL
Cholesterol: 207 mg/dL — ABNORMAL HIGH (ref 0–200)
HDL: 65.4 mg/dL (ref >39.00)
LDL Cholesterol: 118 mg/dL — ABNORMAL HIGH (ref 0–99)
NonHDL: 141.46
Total CHOL/HDL Ratio: 3
Triglycerides: 118 mg/dL (ref 0.0–149.0)
VLDL: 23.6 mg/dL (ref 0.0–40.0)

## 2020-09-12 LAB — TSH: TSH: 2.37 u[IU]/mL (ref 0.35–4.50)

## 2020-09-12 LAB — HEMOGLOBIN A1C: Hgb A1c MFr Bld: 6.3 % (ref 4.6–6.5)

## 2020-09-12 NOTE — Assessment & Plan Note (Signed)
Here for CPX Prediabetes: Checking A1c, doing well with lifestyle although she has gained few pounds. HTN: Diastolic BP today slightly elevated, typically okay, recent BMP normal, continue losartan. Hyperlipidemia: Recent LFTs normal, on Pravachol, check FLP. Autoimmune thyroiditis, goiter: Due to location would like to see another provider, refer to Dr Kelton Pillar, who comes to this office. RTC 6 months to reassess for diabetes and blood pressure

## 2020-09-12 NOTE — Assessment & Plan Note (Signed)
--  Tdap:2014  -  Shingrex: Patient is interested, she is only 31, recommend to check coverage with insurance first.  See AVS. -COVID vaccine x2  (J&J) and Pfizer booster  - Had a flu shot --Female care per Dr Mancel Bale, recently seen.  MMG: 06/28/2020 (K PN) --CCS: Had a colonoscopy 08/04/2020. --Diet and exercise: Exercising regularly, room for improvement on diet, counseled. -Labs:  Lipid panel (nonfasting), A1c, TSH, hep C.

## 2020-09-17 ENCOUNTER — Other Ambulatory Visit: Payer: Self-pay | Admitting: Internal Medicine

## 2020-10-26 ENCOUNTER — Other Ambulatory Visit: Payer: Self-pay

## 2020-10-26 ENCOUNTER — Ambulatory Visit (INDEPENDENT_AMBULATORY_CARE_PROVIDER_SITE_OTHER): Payer: BC Managed Care – PPO | Admitting: *Deleted

## 2020-10-26 DIAGNOSIS — Z23 Encounter for immunization: Secondary | ICD-10-CM | POA: Diagnosis not present

## 2020-10-26 NOTE — Progress Notes (Signed)
Patient here for first shingles vaccine.  Vaccine given in left deltoid and patient tolerated well.  Patient scheduled for next shingles vaccine 12/26/20.

## 2020-10-31 ENCOUNTER — Other Ambulatory Visit: Payer: Self-pay

## 2020-10-31 ENCOUNTER — Encounter: Payer: Self-pay | Admitting: Internal Medicine

## 2020-10-31 ENCOUNTER — Ambulatory Visit (INDEPENDENT_AMBULATORY_CARE_PROVIDER_SITE_OTHER): Payer: BC Managed Care – PPO | Admitting: Internal Medicine

## 2020-10-31 VITALS — BP 140/92 | HR 80 | Ht 63.0 in | Wt 171.5 lb

## 2020-10-31 DIAGNOSIS — E063 Autoimmune thyroiditis: Secondary | ICD-10-CM | POA: Diagnosis not present

## 2020-10-31 NOTE — Progress Notes (Signed)
Name: Wanda Herman  MRN/ DOB: [id number], 1971/07/05    Age/ Sex: 50 y.o., female    PCP: Colon Branch, MD   Reason for Endocrinology Evaluation: Thyroid disease      Date of Initial Endocrinology Evaluation: 10/31/2020     HPI: [name] is a 50 y.o. female with a past medical history of HTN . The patient presented for initial endocrinology clinic visit on 10/31/2020 for consultative assistance with her Thyroid disease .    She has been diagnosed with Hashimoto's Thyroiditis through Dr. Signa Kell sometime in ~2006, after the delivery of her second child .   She has been stressed , not sleeping well as she has interrupted sleep , tends to snore with allergies  Denies constipation  Denies depression per se  Has occasional local neck swelling    No FH of thyroid disease       HISTORY:  Past Medical History:  Past Medical History:  Diagnosis Date   Breast mass, left 07/08/06   Breast mass, right 05/30/04   Fibroid 08/21/2009   Gestational diabetes 2005   Goiter    H/O candidiasis    H/O varicella    Headache(784.0)    Hypertension    Prediabetes 09/30/2014   Thyroiditis, autoimmune    Past Surgical History:  Past Surgical History:  Procedure Laterality Date   ABDOMINAL HYSTERECTOMY  12/04/2011   HYSTERECTOMY ABDOMINAL; NO Oophorectomy---  Surgeon: Delice Lesch, MD;  Location: [facility];  Service: Gynecology;  Laterality: N/A;   CYSTOSCOPY  12/04/2011   Procedure: CYSTOSCOPY;  Surgeon: Delice Lesch, MD;  Location: [facility];  Service: Gynecology;  Laterality: N/A;   LASIK        Social History:  reports that she has never smoked. She has never used smokeless tobacco. She reports that she does not drink alcohol and does not use drugs.  Family History: family history includes Diabetes in her father, maternal grandmother, and mother; Heart attack in an other family member; Hyperlipidemia in her mother; Hypertension in her maternal  grandmother and mother; Ovarian cancer in her maternal aunt.   HOME MEDICATIONS: Allergies as of 10/31/2020   No Known Allergies     Medication List       Accurate as of October 31, 2020  3:11 PM. If you have any questions, ask your nurse or doctor.        azelastine 0.1 % nasal spray Commonly known as: ASTELIN Place 2 sprays into both nostrils at bedtime as needed for rhinitis.   cycloSPORINE 0.05 % ophthalmic emulsion Commonly known as: RESTASIS 1 drop 2 (two) times daily.   famotidine 40 MG tablet Commonly known as: PEPCID Take 1 tablet (40 mg total) by mouth daily.   losartan 25 MG tablet Commonly known as: COZAAR Take 1 tablet (25 mg total) by mouth daily.   pravastatin 20 MG tablet Commonly known as: PRAVACHOL Take 1 tablet (20 mg total) by mouth at bedtime.         REVIEW OF SYSTEMS: A comprehensive ROS was conducted with the patient and is negative except as per HPI and below:  ROS     OBJECTIVE:  VS: BP (!) 140/92    Pulse 80    Ht 5\' 3"  (1.6 m)    Wt 171 lb 8 oz (77.8 kg)    LMP 11/22/2011    SpO2 95%    BMI 30.38 kg/m    Wt Readings from Last 3  Encounters:  10/31/20 171 lb 8 oz (77.8 kg)  09/11/20 175 lb (79.4 kg)  08/16/20 168 lb (76.2 kg)     EXAM: General: Pt appears well and is in NAD  Eyes: External eye exam normal without stare, lid lag or exophthalmos.  EOM intact.  .  Neck: General: Supple without adenopathy. Thyroid: Thyroid size normal.  No goiter or nodules appreciated.   Lungs: Clear with good BS bilat with no rales, rhonchi, or wheezes  Heart: Auscultation: RRR.  Abdomen: Normoactive bowel sounds, soft, nontender, without masses or organomegaly palpable  Extremities:  BL LE: No pretibial edema normal ROM and strength.  Skin: Hair: Texture and amount normal with gender appropriate distribution Skin Inspection: No rashes Skin Palpation: Skin temperature, texture, and thickness normal to palpation  Neuro: Cranial nerves: II - XII  grossly intact  Motor: Normal strength throughout DTRs: 2+ and symmetric in UE without delay in relaxation phase  Mental Status: Judgment, insight: Intact Memory: Intact for recent and remote events Mood and affect: No depression, anxiety, or agitation     DATA REVIEWED: Results for SHAMAINE, MULKERN (MRN [id number]) as of 11/01/2020 08:57  Ref. Range 06/15/2020 00:00 09/11/2020 15:31  TSH Latest Ref Range: 0.35 - 4.50 uIU/mL 2.71 2.37      ASSESSMENT/PLAN/RECOMMENDATIONS:   1. Hashimoto's Disease :  - Pt is biochemically euthyroid  - Non-specific symptoms not related to thyroid as her TSH is normal  - I explained to the patient that Hashimoto's Disease is an autoimmune - mediated destruction of the thyroid gland. The usual course of Hashimoto's thyroiditis is the gradual loss of thyroid function. Overt hypothyroidism occurs at a rate of ~ 5% per year.  - We also discussed risk of iatrogenic hyperthyroidism and its complication with unnecessary LT-4 replacement  - I have encouraged her to continue with healthy lifestyle , we discussed the importance of stress relief  - Pt expressed understanding  - She will need annual TFT's through her PCP 's office or sooner if clinically indicated    F/U PRN   Signed electronically by: Mack Guise, MD  [facility]  [address] [place], [place] [address] Phone: [phone] FAX: [phone]   CC: Colon Branch, [address] [place] [address] Phone: [phone] Fax: [phone]   Return to Endocrinology clinic as below: Future Appointments  Date Time Provider [place]  12/26/2020  2:00 PM [facility] NURSE [facility]  03/15/2021  9:00 AM Colon Branch, MD [facility]

## 2020-10-31 NOTE — Patient Instructions (Signed)
Hashimoto's Disease is an autoimmune - mediated destruction of the thyroid gland. The usual course of Hashimoto's thyroiditis is the gradual loss of thyroid function. Overt hypothyroidism occurs at a rate of ~ 5% per year.   

## 2020-11-01 DIAGNOSIS — E063 Autoimmune thyroiditis: Secondary | ICD-10-CM | POA: Insufficient documentation

## 2020-12-08 ENCOUNTER — Other Ambulatory Visit: Payer: Self-pay | Admitting: Internal Medicine

## 2020-12-21 DIAGNOSIS — H16143 Punctate keratitis, bilateral: Secondary | ICD-10-CM | POA: Diagnosis not present

## 2020-12-21 DIAGNOSIS — H16223 Keratoconjunctivitis sicca, not specified as Sjogren's, bilateral: Secondary | ICD-10-CM | POA: Diagnosis not present

## 2020-12-26 ENCOUNTER — Ambulatory Visit (INDEPENDENT_AMBULATORY_CARE_PROVIDER_SITE_OTHER): Payer: BC Managed Care – PPO

## 2020-12-26 ENCOUNTER — Other Ambulatory Visit: Payer: Self-pay

## 2020-12-26 DIAGNOSIS — Z23 Encounter for immunization: Secondary | ICD-10-CM

## 2020-12-26 NOTE — Progress Notes (Signed)
Patient here for shingrix vaccine. 0.25mL given in left deltoid IM. Patient tolerated well. VIS given. [facility] updated.

## 2021-01-04 DIAGNOSIS — H16143 Punctate keratitis, bilateral: Secondary | ICD-10-CM | POA: Diagnosis not present

## 2021-01-04 DIAGNOSIS — H16223 Keratoconjunctivitis sicca, not specified as Sjogren's, bilateral: Secondary | ICD-10-CM | POA: Diagnosis not present

## 2021-01-17 DIAGNOSIS — H16223 Keratoconjunctivitis sicca, not specified as Sjogren's, bilateral: Secondary | ICD-10-CM | POA: Diagnosis not present

## 2021-01-17 DIAGNOSIS — H16143 Punctate keratitis, bilateral: Secondary | ICD-10-CM | POA: Diagnosis not present

## 2021-02-22 DIAGNOSIS — H16223 Keratoconjunctivitis sicca, not specified as Sjogren's, bilateral: Secondary | ICD-10-CM | POA: Diagnosis not present

## 2021-03-15 ENCOUNTER — Ambulatory Visit: Payer: BC Managed Care – PPO | Admitting: Internal Medicine

## 2021-03-17 ENCOUNTER — Other Ambulatory Visit: Payer: Self-pay | Admitting: Internal Medicine

## 2021-04-05 ENCOUNTER — Encounter: Payer: Self-pay | Admitting: Internal Medicine

## 2021-04-05 ENCOUNTER — Other Ambulatory Visit: Payer: Self-pay

## 2021-04-05 ENCOUNTER — Ambulatory Visit (INDEPENDENT_AMBULATORY_CARE_PROVIDER_SITE_OTHER): Payer: BC Managed Care – PPO | Admitting: Internal Medicine

## 2021-04-05 VITALS — BP 136/80 | HR 71 | Temp 98.5°F | Resp 16 | Ht 63.0 in | Wt 170.1 lb

## 2021-04-05 DIAGNOSIS — R739 Hyperglycemia, unspecified: Secondary | ICD-10-CM | POA: Diagnosis not present

## 2021-04-05 DIAGNOSIS — Z7185 Encounter for immunization safety counseling: Secondary | ICD-10-CM | POA: Diagnosis not present

## 2021-04-05 DIAGNOSIS — I10 Essential (primary) hypertension: Secondary | ICD-10-CM

## 2021-04-05 LAB — CBC WITH DIFFERENTIAL/PLATELET
Basophils Absolute: 0.1 10*3/uL (ref 0.0–0.1)
Basophils Relative: 1.4 % (ref 0.0–3.0)
Eosinophils Absolute: 0.1 10*3/uL (ref 0.0–0.7)
Eosinophils Relative: 2.7 % (ref 0.0–5.0)
HCT: 38.3 % (ref 36.0–46.0)
Hemoglobin: 13.1 g/dL (ref 12.0–15.0)
Lymphocytes Relative: 41.8 % (ref 12.0–46.0)
Lymphs Abs: 1.7 10*3/uL (ref 0.7–4.0)
MCHC: 34.1 g/dL (ref 30.0–36.0)
MCV: 90.4 fl (ref 78.0–100.0)
Monocytes Absolute: 0.3 10*3/uL (ref 0.1–1.0)
Monocytes Relative: 7.6 % (ref 3.0–12.0)
Neutro Abs: 1.9 10*3/uL (ref 1.4–7.7)
Neutrophils Relative %: 46.5 % (ref 43.0–77.0)
Platelets: 322 10*3/uL (ref 150.0–400.0)
RBC: 4.24 Mil/uL (ref 3.87–5.11)
RDW: 12.5 % (ref 11.5–15.5)
WBC: 4.1 10*3/uL (ref 4.0–10.5)

## 2021-04-05 LAB — BASIC METABOLIC PANEL
BUN: 13 mg/dL (ref 6–23)
CO2: 27 mEq/L (ref 19–32)
Calcium: 9.4 mg/dL (ref 8.4–10.5)
Chloride: 103 mEq/L (ref 96–112)
Creatinine, Ser: 0.85 mg/dL (ref 0.40–1.20)
GFR: 79.78 mL/min (ref >60.00)
Glucose, Bld: 98 mg/dL (ref 70–99)
Potassium: 4.4 mEq/L (ref 3.5–5.1)
Sodium: 138 mEq/L (ref 135–145)

## 2021-04-05 LAB — HEMOGLOBIN A1C: Hgb A1c MFr Bld: 6.6 % — ABNORMAL HIGH (ref 4.6–6.5)

## 2021-04-05 NOTE — Progress Notes (Signed)
Subjective:    Patient ID: Wanda Herman, female    DOB: 02/08/1971, 50 y.o.   MRN: [id number]  DOS:  04/05/2021 Type of visit - description: rov  Since the last office visit is doing okay. We talk about hypertension, hyperglycemia, Hashimoto's.   Review of Systems See above   Past Medical History:  Diagnosis Date   Breast mass, left 07/08/06   Breast mass, right 05/30/04   Fibroid 08/21/2009   Gestational diabetes 2005   Goiter    H/O candidiasis    H/O varicella    Headache(784.0)    Hypertension    Prediabetes 09/30/2014   Thyroiditis, autoimmune     Past Surgical History:  Procedure Laterality Date   ABDOMINAL HYSTERECTOMY  12/04/2011   HYSTERECTOMY ABDOMINAL; NO Oophorectomy---  Surgeon: Delice Lesch, MD;  Location: [facility];  Service: Gynecology;  Laterality: N/A;   CYSTOSCOPY  12/04/2011   Procedure: CYSTOSCOPY;  Surgeon: Delice Lesch, MD;  Location: [facility];  Service: Gynecology;  Laterality: N/A;   LASIK      Allergies as of 04/05/2021   No Known Allergies      Medication List        Accurate as of April 05, 2021  9:32 AM. If you have any questions, ask your nurse or doctor.          azelastine 0.1 % nasal spray Commonly known as: ASTELIN Place 2 sprays into both nostrils at bedtime as needed for rhinitis.   cycloSPORINE 0.05 % ophthalmic emulsion Commonly known as: RESTASIS 1 drop 2 (two) times daily.   famotidine 40 MG tablet Commonly known as: PEPCID Take 1 tablet (40 mg total) by mouth daily.   losartan 25 MG tablet Commonly known as: COZAAR Take 1 tablet (25 mg total) by mouth daily.   pravastatin 20 MG tablet Commonly known as: PRAVACHOL Take 1 tablet (20 mg total) by mouth at bedtime.           Objective:   Physical Exam BP 136/80 (BP Location: Left Arm, Patient Position: Sitting, Cuff Size: Small)   Pulse 71   Temp 98.5 F (36.9 C) (Oral)   Resp 16   Ht '5\' 3"'$  (1.6 m)   Wt 170 lb 2 oz (77.2 kg)   LMP  11/22/2011   SpO2 98%   BMI 30.14 kg/m  General:   Well developed, NAD, BMI noted. HEENT:  Normocephalic . Face symmetric, atraumatic Lungs:  CTA B Normal respiratory effort, no intercostal retractions, no accessory muscle use. Heart: RRR,  no murmur.  Lower extremities: no pretibial edema bilaterally  Skin: Not pale. Not jaundice Neurologic:  alert & oriented X3.  Speech normal, gait appropriate for age and unassisted Psych--  Cognition and judgment appear intact.  Cooperative with normal attention span and concentration.  Behavior appropriate. No anxious or depressed appearing.      Assessment      Assessment Prediabetes -- metformin intolerant, gi sx. Self d/c 2016 HTN Hyperlipidemia Reactive airway dz? Never rx a inhaler as off 01-2016 Thyroid: h/o Hashimoto, saw endo 10/2020, rec TFT q year and f/u prn    Migraines  Dyspepsia -- on zantac Dry eyes-- sees eye doctor x 2/ year  Covid infex July 2022, mild, no Rx   PLAN: Prediabetes: Last A1c 6.3, higher than before, recheck today.  Encourage a healthy diet with decreased carbohydrates intake. HTN: No ambulatory BPs, BP today is very good, continue losartan, check a BMP and CBC, rec  ambulatory BPs. Thyroid: h/o Hashimoto, saw endo 10/2020, rec TFT q year and f/u prn.  Due to recheck on RTC Vaccines: Rec COVID booster flu shot every year RTC CPX 08-2021  This visit occurred during the SARS-CoV-2 public health emergency.  Safety protocols were in place, including screening questions prior to the visit, additional usage of staff PPE, and extensive cleaning of exam room while observing appropriate contact time as indicated for disinfecting solutions.

## 2021-04-05 NOTE — Patient Instructions (Addendum)
Recommend to proceed with covid vaccine #3- you may do this downstairs at our pharmacy today if you like  Start checking your blood pressures, once or twice a month. BP GOAL is between 110/65 and  135/85. If it is consistently higher or lower, let me know    GO TO THE LAB : Get the blood work     [place], [place] back for a physical exam by 08-2021

## 2021-04-06 NOTE — Assessment & Plan Note (Signed)
Prediabetes: Last A1c 6.3, higher than before, recheck today.  Encourage a healthy diet with decreased carbohydrates intake. HTN: No ambulatory BPs, BP today is very good, continue losartan, check a BMP and CBC, rec ambulatory BPs. Thyroid: h/o Hashimoto, saw endo 10/2020, rec TFT q year and f/u prn.  Due to recheck on RTC Vaccines: Rec COVID booster flu shot every year RTC CPX 08-2021

## 2021-04-10 DIAGNOSIS — H16223 Keratoconjunctivitis sicca, not specified as Sjogren's, bilateral: Secondary | ICD-10-CM | POA: Diagnosis not present

## 2021-05-22 DIAGNOSIS — N6310 Unspecified lump in the right breast, unspecified quadrant: Secondary | ICD-10-CM | POA: Diagnosis not present

## 2021-05-22 DIAGNOSIS — Z1231 Encounter for screening mammogram for malignant neoplasm of breast: Secondary | ICD-10-CM | POA: Diagnosis not present

## 2021-05-22 DIAGNOSIS — Z01419 Encounter for gynecological examination (general) (routine) without abnormal findings: Secondary | ICD-10-CM | POA: Diagnosis not present

## 2021-05-22 LAB — HM MAMMOGRAPHY

## 2021-05-28 ENCOUNTER — Other Ambulatory Visit: Payer: Self-pay | Admitting: Obstetrics and Gynecology

## 2021-05-28 DIAGNOSIS — N6310 Unspecified lump in the right breast, unspecified quadrant: Secondary | ICD-10-CM

## 2021-06-08 ENCOUNTER — Ambulatory Visit
Admission: RE | Admit: 2021-06-08 | Discharge: 2021-06-08 | Disposition: A | Discharge status: Home / Self Care | Payer: BC Managed Care – PPO | Source: Ambulatory Visit | Attending: Obstetrics and Gynecology | Admitting: Obstetrics and Gynecology

## 2021-06-08 ENCOUNTER — Other Ambulatory Visit: Payer: Self-pay

## 2021-06-08 DIAGNOSIS — N6311 Unspecified lump in the right breast, upper outer quadrant: Secondary | ICD-10-CM | POA: Diagnosis not present

## 2021-06-08 DIAGNOSIS — N644 Mastodynia: Secondary | ICD-10-CM | POA: Diagnosis not present

## 2021-06-08 DIAGNOSIS — N6310 Unspecified lump in the right breast, unspecified quadrant: Secondary | ICD-10-CM

## 2021-06-08 DIAGNOSIS — R922 Inconclusive mammogram: Secondary | ICD-10-CM | POA: Diagnosis not present

## 2021-06-22 ENCOUNTER — Other Ambulatory Visit: Payer: Self-pay | Admitting: Internal Medicine

## 2021-08-01 ENCOUNTER — Encounter: Payer: Self-pay | Admitting: Internal Medicine

## 2021-08-03 ENCOUNTER — Encounter: Payer: Self-pay | Admitting: Family Medicine

## 2021-08-03 ENCOUNTER — Ambulatory Visit (INDEPENDENT_AMBULATORY_CARE_PROVIDER_SITE_OTHER): Payer: BC Managed Care – PPO | Admitting: Family Medicine

## 2021-08-03 VITALS — BP 120/88 | HR 94 | Temp 98.2°F | Resp 18 | Ht 63.0 in | Wt 169.8 lb

## 2021-08-03 DIAGNOSIS — J029 Acute pharyngitis, unspecified: Secondary | ICD-10-CM | POA: Diagnosis not present

## 2021-08-03 DIAGNOSIS — R051 Acute cough: Secondary | ICD-10-CM | POA: Diagnosis not present

## 2021-08-03 DIAGNOSIS — T7840XA Allergy, unspecified, initial encounter: Secondary | ICD-10-CM | POA: Insufficient documentation

## 2021-08-03 LAB — POC COVID19 BINAXNOW: SARS Coronavirus 2 Ag: NEGATIVE

## 2021-08-03 MED ORDER — AZELASTINE HCL 0.1 % NA SOLN: 2.0000 | Freq: Every evening | NASAL | 3 refills | Status: DC | PRN

## 2021-08-03 MED ORDER — AMOXICILLIN 875 MG PO TABS: 875.0000 mg | ORAL_TABLET | Freq: Two times a day (BID) | ORAL | 0 refills | Status: AC

## 2021-08-03 MED ORDER — LEVOCETIRIZINE DIHYDROCHLORIDE 5 MG PO TABS: 5.0000 mg | ORAL_TABLET | Freq: Every evening | ORAL | Status: DC

## 2021-08-03 NOTE — Patient Instructions (Signed)

## 2021-08-03 NOTE — Assessment & Plan Note (Signed)
Take astelin , antihistamine and flonase if needed

## 2021-08-03 NOTE — Progress Notes (Addendum)
Subjective:   By signing my name below, I, Zite Okoli, attest that this documentation has been prepared under the direction and in the presence of  Roma Schanz R DO. 08/03/2021    Patient ID: Wanda Herman, female    DOB: 16-May-1971, 50 y.o.   MRN: [id number]  Chief Complaint  Patient presents with   Sore Throat    Pt states sxs since yesterday. Pt states having sore throat and non productive cough, no fever.     HPI Patient is in today for an office visit.  She is reporting symptoms of sore throat,cough, congestion and ear pain that started yesterday. Denies fevers. She used Nyquil to sleep last night. Normally uses 5 mg xyzal for her allergies. Her husband is also sick but has not come into contact with anyone who has strep.  She is requesting for a refill on 0.1% Astelin.  Covid-19 test was negative.  Past Medical History:  Diagnosis Date   Breast mass, left 07/08/06   Breast mass, right 05/30/04   Fibroid 08/21/2009   Gestational diabetes 2005   Goiter    H/O candidiasis    H/O varicella    Headache(784.0)    Hypertension    Prediabetes 09/30/2014   Thyroiditis, autoimmune     Past Surgical History:  Procedure Laterality Date   ABDOMINAL HYSTERECTOMY  12/04/2011   HYSTERECTOMY ABDOMINAL; NO Oophorectomy---  Surgeon: Delice Lesch, MD;  Location: [facility];  Service: Gynecology;  Laterality: N/A;   CYSTOSCOPY  12/04/2011   Procedure: CYSTOSCOPY;  Surgeon: Delice Lesch, MD;  Location: [facility];  Service: Gynecology;  Laterality: N/A;   LASIK      Family History  Problem Relation Age of Onset   Diabetes Maternal Grandmother    Hypertension Maternal Grandmother    Ovarian cancer Maternal Aunt        ovarian cancer   Diabetes Father    Heart attack Other        GF at 50 y/o   Diabetes Mother    Hypertension Mother    Hyperlipidemia Mother    Colon cancer Neg Hx    Breast cancer Neg Hx     Social History   Socioeconomic History   Marital  status: Married    Spouse name: Not on file   Number of children: 2   Years of education: Not on file   Highest education level: Not on file  Occupational History   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   Younger daughter lives w/ them   Social Determinants of Health   Financial Resource Strain: Not on file  Food Insecurity: Not on file  Transportation Needs: Not on file  Physical Activity: Not on file  Stress: Not on file  Social Connections: Not on file  Intimate Partner Violence: Not on file    Outpatient Medications Prior to Visit  Medication Sig Dispense Refill   cycloSPORINE (RESTASIS) 0.05 % ophthalmic emulsion 1 drop 2 (two) times daily.     famotidine (PEPCID) 40 MG tablet  Take 1 tablet (40 mg total) by mouth daily. 90 tablet 3   losartan (COZAAR) 25 MG tablet Take 1 tablet (25 mg total) by mouth daily. 90 tablet 1   pravastatin (PRAVACHOL) 20 MG tablet Take 1 tablet (20 mg total) by mouth daily. 90 tablet 1   azelastine (ASTELIN) 0.1 % nasal spray Place 2 sprays into both nostrils at bedtime as needed for rhinitis. 30 mL 3   No facility-administered medications prior to visit.    No Known Allergies  Review of Systems  Constitutional:  Negative for fever.  HENT:  Positive for congestion, ear pain and sore throat. Negative for hearing loss and sinus pain.   Eyes:  Negative for blurred vision and pain.  Respiratory:  Positive for cough. Negative for sputum production, shortness of breath and wheezing.   Cardiovascular:  Negative for chest pain and palpitations.  Gastrointestinal:  Negative for blood  in stool, constipation, diarrhea, nausea and vomiting.  Genitourinary:  Negative for dysuria, frequency, hematuria and urgency.  Musculoskeletal:  Negative for back pain, falls and myalgias.  Neurological:  Negative for dizziness, sensory change, loss of consciousness, weakness and headaches.  Endo/Heme/Allergies:  Negative for environmental allergies. Does not bruise/bleed easily.  Psychiatric/Behavioral:  Negative for depression and suicidal ideas. The patient is not nervous/anxious and does not have insomnia.       Objective:    Physical Exam Constitutional:      General: She is not in acute distress.    Appearance: Normal appearance. She is not ill-appearing.  HENT:     Head: Normocephalic and atraumatic.     Right Ear: External ear normal.     Left Ear: External ear normal.     Mouth/Throat:     Pharynx: Posterior oropharyngeal erythema present.  Eyes:     Extraocular Movements: Extraocular movements intact.     Pupils: Pupils are equal, round, and reactive to light.  Cardiovascular:     Rate and Rhythm: Normal rate and regular rhythm.     Pulses: Normal pulses.     Heart sounds: Normal heart sounds. No murmur heard.   No gallop.  Pulmonary:     Effort: Pulmonary effort is normal. No respiratory distress.     Breath sounds: Normal breath sounds. No wheezing, rhonchi or rales.  Abdominal:     General: Bowel sounds are normal. There is no distension.     Palpations: Abdomen is soft. There is no mass.     Tenderness: There is no abdominal tenderness. There is no guarding or rebound.     Hernia: No hernia is present.  Musculoskeletal:     Cervical back: Normal range of motion and neck supple.  Lymphadenopathy:     Cervical: Cervical adenopathy present.  Skin:    General: Skin is warm and dry.  Neurological:     Mental Status: She is alert and oriented to person, place, and time.  Psychiatric:        Behavior: Behavior normal.    BP 120/88 (BP Location: Left Arm,  Patient Position: Sitting, Cuff Size: Normal)    Pulse 94    Temp 98.2 F (36.8 C) (Oral)    Resp 18    Ht 5\' 3"  (1.6 m)    Wt 169 lb 12.8 oz (77 kg)    LMP 11/22/2011    SpO2 97%    BMI 30.08 kg/m  Wt Readings from Last 3 Encounters:  08/03/21 169 lb 12.8 oz (77 kg)  04/05/21 170 lb  2 oz (77.2 kg)  10/31/20 171 lb 8 oz (77.8 kg)    Diabetic Foot Exam - Simple   No data filed    Lab Results  Component Value Date   WBC 4.1 04/05/2021   HGB 13.1 04/05/2021   HCT 38.3 04/05/2021   PLT 322.0 04/05/2021   GLUCOSE 98 04/05/2021   CHOL 207 (H) 09/11/2020   TRIG 118.0 09/11/2020   HDL 65.40 09/11/2020   LDLDIRECT 133.6 07/17/2012   LDLCALC 118 (H) 09/11/2020   ALT 14 06/15/2020   AST 20 06/15/2020   NA 138 04/05/2021   K 4.4 04/05/2021   CL 103 04/05/2021   CREATININE 0.85 04/05/2021   BUN 13 04/05/2021   CO2 27 04/05/2021   TSH 2.37 09/11/2020   HGBA1C 6.6 (H) 04/05/2021    Lab Results  Component Value Date   TSH 2.37 09/11/2020   Lab Results  Component Value Date   WBC 4.1 04/05/2021   HGB 13.1 04/05/2021   HCT 38.3 04/05/2021   MCV 90.4 04/05/2021   PLT 322.0 04/05/2021   Lab Results  Component Value Date   NA 138 04/05/2021   K 4.4 04/05/2021   CO2 27 04/05/2021   GLUCOSE 98 04/05/2021   BUN 13 04/05/2021   CREATININE 0.85 04/05/2021   BILITOT 0.4 08/16/2019   ALKPHOS 68 06/15/2020   AST 20 06/15/2020   ALT 14 06/15/2020   PROT 7.0 08/16/2019   ALBUMIN 4.7 06/15/2020   CALCIUM 9.4 04/05/2021   GFR 79.78 04/05/2021   Lab Results  Component Value Date   CHOL 207 (H) 09/11/2020   Lab Results  Component Value Date   HDL 65.40 09/11/2020   Lab Results  Component Value Date   LDLCALC 118 (H) 09/11/2020   Lab Results  Component Value Date   TRIG 118.0 09/11/2020   Lab Results  Component Value Date   CHOLHDL 3 09/11/2020   Lab Results  Component Value Date   HGBA1C 6.6 (H) 04/05/2021   Covid-- negative     Assessment & Plan:   Problem  List Items Addressed This Visit       Unprioritized   Allergies - Primary    Take astelin , antihistamine and flonase if needed      Relevant Medications   azelastine (ASTELIN) 0.1 % nasal spray   levocetirizine (XYZAL) 5 MG tablet   Pharyngitis    Most likely viral  Take antihistamine flonase and astelin covid neg If no better next week or symptoms worsen can take rx augmentin       Relevant Medications   amoxicillin (AMOXIL) 875 MG tablet   Other Visit Diagnoses     Acute cough       Relevant Orders   POC COVID-19 (Completed)       Meds ordered this encounter  Medications   azelastine (ASTELIN) 0.1 % nasal spray    Sig: Place 2 sprays into both nostrils at bedtime as needed for rhinitis.    Dispense:  30 mL    Refill:  3   levocetirizine (XYZAL) 5 MG tablet    Sig: Take 1 tablet (5 mg total) by mouth every evening.   amoxicillin (AMOXIL) 875 MG tablet    Sig: Take 1 tablet (875 mg total) by mouth 2 (two) times daily for 10 days.    Dispense:  20 tablet    Refill:  0    I,Zite Okoli,acting as a scribe for [employer], DO.,have documented  all relevant documentation on the behalf of Ann Held, DO,as directed by  Ann Held, DO while in the presence of Ann Held, DO.   I, Ann Held DO., personally preformed the services described in this documentation.  All medical record entries made by the scribe were at my direction and in my presence.  I have reviewed the chart and discharge instructions (if applicable) and agree that the record reflects my personal performance and is accurate and complete. 08/03/2021

## 2021-08-03 NOTE — Assessment & Plan Note (Signed)
Most likely viral  Take antihistamine flonase and astelin covid neg If no better next week or symptoms worsen can take rx augmentin

## 2021-09-13 ENCOUNTER — Encounter: Payer: Self-pay | Admitting: Internal Medicine

## 2021-09-13 ENCOUNTER — Ambulatory Visit (INDEPENDENT_AMBULATORY_CARE_PROVIDER_SITE_OTHER): Payer: BC Managed Care – PPO | Admitting: Internal Medicine

## 2021-09-13 VITALS — BP 126/80 | HR 71 | Temp 98.2°F | Resp 16 | Ht 63.0 in | Wt 173.2 lb

## 2021-09-13 DIAGNOSIS — Z0000 Encounter for general adult medical examination without abnormal findings: Secondary | ICD-10-CM

## 2021-09-13 DIAGNOSIS — E063 Autoimmune thyroiditis: Secondary | ICD-10-CM | POA: Diagnosis not present

## 2021-09-13 DIAGNOSIS — N951 Menopausal and female climacteric states: Secondary | ICD-10-CM | POA: Diagnosis not present

## 2021-09-13 DIAGNOSIS — R739 Hyperglycemia, unspecified: Secondary | ICD-10-CM

## 2021-09-13 DIAGNOSIS — N390 Urinary tract infection, site not specified: Secondary | ICD-10-CM | POA: Diagnosis not present

## 2021-09-13 DIAGNOSIS — I10 Essential (primary) hypertension: Secondary | ICD-10-CM | POA: Diagnosis not present

## 2021-09-13 LAB — URINALYSIS, ROUTINE W REFLEX MICROSCOPIC
Bilirubin Urine: NEGATIVE
Hgb urine dipstick: NEGATIVE
Ketones, ur: NEGATIVE
Leukocytes,Ua: NEGATIVE
Nitrite: NEGATIVE
Specific Gravity, Urine: 1.025 (ref 1.000–1.030)
Total Protein, Urine: NEGATIVE
Urine Glucose: NEGATIVE
Urobilinogen, UA: 0.2 (ref 0.0–1.0)
pH: 5.5 (ref 5.0–8.0)

## 2021-09-13 LAB — COMPREHENSIVE METABOLIC PANEL
ALT: 40 U/L — ABNORMAL HIGH (ref 0–35)
AST: 36 U/L (ref 0–37)
Albumin: 4.6 g/dL (ref 3.5–5.2)
Alkaline Phosphatase: 61 U/L (ref 39–117)
BUN: 11 mg/dL (ref 6–23)
CO2: 27 mEq/L (ref 19–32)
Calcium: 9.3 mg/dL (ref 8.4–10.5)
Chloride: 103 mEq/L (ref 96–112)
Creatinine, Ser: 0.85 mg/dL (ref 0.40–1.20)
GFR: 79.53 mL/min (ref >60.00)
Glucose, Bld: 104 mg/dL — ABNORMAL HIGH (ref 70–99)
Potassium: 4.5 mEq/L (ref 3.5–5.1)
Sodium: 139 mEq/L (ref 135–145)
Total Bilirubin: 0.6 mg/dL (ref 0.2–1.2)
Total Protein: 7.3 g/dL (ref 6.0–8.3)

## 2021-09-13 LAB — LIPID PANEL
Cholesterol: 192 mg/dL (ref 0–200)
HDL: 72.6 mg/dL (ref >39.00)
LDL Cholesterol: 107 mg/dL — ABNORMAL HIGH (ref 0–99)
NonHDL: 119.73
Total CHOL/HDL Ratio: 3
Triglycerides: 64 mg/dL (ref 0.0–149.0)
VLDL: 12.8 mg/dL (ref 0.0–40.0)

## 2021-09-13 LAB — HEMOGLOBIN A1C: Hgb A1c MFr Bld: 6.3 % (ref 4.6–6.5)

## 2021-09-13 LAB — FOLLICLE STIMULATING HORMONE: FSH: 68.2 m[IU]/mL

## 2021-09-13 LAB — T4, FREE: Free T4: 0.71 ng/dL (ref 0.60–1.60)

## 2021-09-13 LAB — LUTEINIZING HORMONE: LH: 42.17 m[IU]/mL

## 2021-09-13 LAB — TSH: TSH: 2.76 u[IU]/mL (ref 0.35–5.50)

## 2021-09-13 LAB — T3, FREE: T3, Free: 3.7 pg/mL (ref 2.3–4.2)

## 2021-09-13 NOTE — Patient Instructions (Addendum)
HEALTHY SLEEP Sleep hygiene: Basic rules for a good night's sleep  Sleep only as much as you need to feel rested and then get out of bed  Keep a regular sleep schedule  Avoid forcing sleep  Exercise regularly for at least 20 minutes, preferably 4 to 5 hours before bedtime  Avoid caffeinated beverages after lunch  Avoid alcohol near bedtime: no "night cap"  Avoid smoking, especially in the evening  Do not go to bed hungry  Adjust bedroom environment  Avoid prolonged use of light-emitting screens before bedtime   Deal with your worries before bedtime   You can use melatonin every night to help with sleep.  Proceed with a COVID-vaccine  GO TO THE LAB : Get the blood work     [place], [place] back for a checkup in 6 months

## 2021-09-13 NOTE — Progress Notes (Signed)
Subjective:    Patient ID: Wanda Herman, female    DOB: July 21, 1971, 51 y.o.   MRN: [id number]  DOS:  09/13/2021 Type of visit - description: cpx  Reports a lot of stress at work over the last few months. That has makes sleep difficult. No anxiety or depression per se just stress.  Back in November for few weeks had hot flashes/night sweats, they already resolved. She thinks she could be perimenopausal. Denies fever chills History of hysterectomy, no vaginal discharge, rash or spotting.  Last week had cloudy urine, some dysuria and frequency.  Urine was pink x 1, symptoms are much better this week  Wt Readings from Last 3 Encounters:  09/13/21 173 lb 4 oz (78.6 kg)  08/03/21 169 lb 12.8 oz (77 kg)  04/05/21 170 lb 2 oz (77.2 kg)     Review of Systems  Other than above, a 14 point review of systems is negative      Past Medical History:  Diagnosis Date   Breast mass, left 07/08/06   Breast mass, right 05/30/04   Fibroid 08/21/2009   Gestational diabetes 2005   Goiter    H/O candidiasis    H/O varicella    Headache(784.0)    Hypertension    Prediabetes 09/30/2014   Thyroiditis, autoimmune     Past Surgical History:  Procedure Laterality Date   ABDOMINAL HYSTERECTOMY  12/04/2011   HYSTERECTOMY ABDOMINAL; NO Oophorectomy---  Surgeon: Delice Lesch, MD;  Location: [facility];  Service: Gynecology;  Laterality: N/A;   CYSTOSCOPY  12/04/2011   Procedure: CYSTOSCOPY;  Surgeon: Delice Lesch, MD;  Location: [facility];  Service: Gynecology;  Laterality: N/A;   LASIK     Social History   Socioeconomic History   Marital status: Married    Spouse name: Not on file   Number of children: 2   Years of education: Not on file   Highest education level: Not on file  Occupational History   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   Younger daughter lives w/ them   Social Determinants of Health   Financial Resource Strain: Not on file  Food Insecurity: Not on file  Transportation Needs: Not on file  Physical Activity: Not on file  Stress: Not on file  Social Connections: Not on file  Intimate Partner Violence: Not on file     Current Outpatient Medications  Medication Instructions   azelastine (ASTELIN) 0.1 % nasal spray 2 sprays, Each Nare, At bedtime PRN   cycloSPORINE (RESTASIS) 0.05 % ophthalmic emulsion 1 drop, 2 times daily   famotidine (PEPCID) 40 mg, Oral, Daily   levocetirizine (XYZAL) 5 mg, Oral, Every evening   losartan (COZAAR) 25 mg, Oral, Daily   pravastatin (PRAVACHOL) 20 mg, Oral, Daily       Objective:   Physical Exam BP 126/80 (BP Location: Left Arm, Patient Position: Sitting, Cuff Size: Small)    Pulse 71    Temp 98.2 F (36.8 C) (Oral)    Resp 16    Ht 5\' 3"  (  1.6 m)    Wt 173 lb 4 oz (78.6 kg)    LMP 11/22/2011    SpO2 98%    BMI 30.69 kg/m  General: Well developed, NAD, BMI noted Neck: No  thyromegaly  HEENT:  Normocephalic . Face symmetric, atraumatic Lungs:  CTA B Normal respiratory effort, no intercostal retractions, no accessory muscle use. Heart: RRR,  no murmur.  Abdomen:  Not distended, soft, non-tender. No rebound or rigidity.   Lower extremities: no pretibial edema bilaterally  Skin: Exposed areas without rash. Not pale. Not jaundice Neurologic:  alert & oriented X3.  Speech normal, gait appropriate for age and unassisted Strength symmetric and appropriate for age.  Psych: Cognition and judgment appear intact.  Cooperative with normal attention span and concentration.  Behavior appropriate. No anxious or depressed appearing.      Assessment      Assessment Prediabetes -- metformin intolerant, gi sx. Self d/c 2016 HTN Hyperlipidemia Reactive airway dz? Never rx a inhaler as off 01-2016 Thyroid: h/o Hashimoto, saw endo 10/2020, rec TFT q year and f/u prn    Migraines  Dyspepsia -- on zantac Dry eyes-- sees eye doctor x 2/ year  Covid infex July 2022, mild, no Rx   PLAN: Here for CPX Prediabetes: Check A1c, diet discussed. HTN: BP today is very good, no ambulatory BPs, Continue losartan High cholesterol: On Pravachol, labs. History of Hashimoto: Check TFTs. UTI?  See HPI, symptoms started last week and not better.  Check UA urine culture. Stress, difficulty sleeping, listening therapy provided, no anxiety or depression per se, sleep tips provided, try melatonin nightly.  She anticipates a stress (work-related) will decrease in the next few weeks. Perimenopausal?  Had hot flashes for few weeks last year, check FSH and LH. RTC 6 months   This visit occurred during the SARS-CoV-2 public health emergency.  Safety protocols were in place, including screening questions prior to the visit, additional usage of staff PPE, and extensive cleaning of exam room while observing appropriate contact time as indicated for disinfecting solutions.

## 2021-09-13 NOTE — Assessment & Plan Note (Signed)
°--  Tdap:2014  -  Shingrex x2, completed . -COVID vaccine:  booster rec, she is receptive to the idea - Had a flu shot --Female care per Dr Mancel Bale, recently seen.  MMG: 05-2021 (K PN) --CCS: Had a colonoscopy 08/04/2020. --Diet and exercise: Discussed -Labs: CMP, FLP, A1c, FSH, LH, TSH, T3, T4, UA, urine culture

## 2021-09-13 NOTE — Assessment & Plan Note (Signed)
Here for CPX Prediabetes: Check A1c, diet discussed. HTN: BP today is very good, no ambulatory BPs, Continue losartan High cholesterol: On Pravachol, labs. History of Hashimoto: Check TFTs. UTI?  See HPI, symptoms started last week and not better.  Check UA urine culture. Stress, difficulty sleeping, listening therapy provided, no anxiety or depression per se, sleep tips provided, try melatonin nightly.  She anticipates a stress (work-related) will decrease in the next few weeks. Perimenopausal?  Had hot flashes for few weeks last year, check FSH and LH. RTC 6 months

## 2021-09-14 LAB — URINE CULTURE
MICRO NUMBER:: 12923918
Result:: NO GROWTH
SPECIMEN QUALITY:: ADEQUATE

## 2021-09-19 DIAGNOSIS — D1723 Benign lipomatous neoplasm of skin and subcutaneous tissue of right leg: Secondary | ICD-10-CM | POA: Diagnosis not present

## 2021-09-19 DIAGNOSIS — L821 Other seborrheic keratosis: Secondary | ICD-10-CM | POA: Diagnosis not present

## 2021-11-19 DIAGNOSIS — Z23 Encounter for immunization: Secondary | ICD-10-CM | POA: Diagnosis not present

## 2021-11-19 DIAGNOSIS — Z0000 Encounter for general adult medical examination without abnormal findings: Secondary | ICD-10-CM | POA: Diagnosis not present

## 2021-11-19 DIAGNOSIS — Z1159 Encounter for screening for other viral diseases: Secondary | ICD-10-CM | POA: Diagnosis not present

## 2021-11-19 DIAGNOSIS — Z113 Encounter for screening for infections with a predominantly sexual mode of transmission: Secondary | ICD-10-CM | POA: Diagnosis not present

## 2021-11-19 DIAGNOSIS — Z114 Encounter for screening for human immunodeficiency virus [HIV]: Secondary | ICD-10-CM | POA: Diagnosis not present

## 2021-12-20 DIAGNOSIS — Z23 Encounter for immunization: Secondary | ICD-10-CM | POA: Diagnosis not present

## 2022-01-04 ENCOUNTER — Other Ambulatory Visit: Payer: Self-pay | Admitting: Internal Medicine

## 2022-03-11 DIAGNOSIS — D259 Leiomyoma of uterus, unspecified: Secondary | ICD-10-CM | POA: Insufficient documentation

## 2022-03-13 DIAGNOSIS — H16223 Keratoconjunctivitis sicca, not specified as Sjogren's, bilateral: Secondary | ICD-10-CM | POA: Diagnosis not present

## 2022-03-13 DIAGNOSIS — H16143 Punctate keratitis, bilateral: Secondary | ICD-10-CM | POA: Diagnosis not present

## 2022-03-18 ENCOUNTER — Ambulatory Visit (INDEPENDENT_AMBULATORY_CARE_PROVIDER_SITE_OTHER): Payer: BC Managed Care – PPO | Admitting: Internal Medicine

## 2022-03-18 ENCOUNTER — Encounter: Payer: Self-pay | Admitting: Internal Medicine

## 2022-03-18 VITALS — BP 122/84 | HR 54 | Temp 98.1°F | Resp 16 | Ht 63.0 in | Wt 174.2 lb

## 2022-03-18 DIAGNOSIS — T7840XA Allergy, unspecified, initial encounter: Secondary | ICD-10-CM

## 2022-03-18 DIAGNOSIS — T7840XD Allergy, unspecified, subsequent encounter: Secondary | ICD-10-CM | POA: Diagnosis not present

## 2022-03-18 DIAGNOSIS — R739 Hyperglycemia, unspecified: Secondary | ICD-10-CM

## 2022-03-18 DIAGNOSIS — H04129 Dry eye syndrome of unspecified lacrimal gland: Secondary | ICD-10-CM | POA: Diagnosis not present

## 2022-03-18 DIAGNOSIS — R7303 Prediabetes: Secondary | ICD-10-CM | POA: Diagnosis not present

## 2022-03-18 DIAGNOSIS — I10 Essential (primary) hypertension: Secondary | ICD-10-CM

## 2022-03-18 DIAGNOSIS — G43909 Migraine, unspecified, not intractable, without status migrainosus: Secondary | ICD-10-CM

## 2022-03-18 DIAGNOSIS — K30 Functional dyspepsia: Secondary | ICD-10-CM

## 2022-03-18 DIAGNOSIS — R7989 Other specified abnormal findings of blood chemistry: Secondary | ICD-10-CM

## 2022-03-18 DIAGNOSIS — Z8639 Personal history of other endocrine, nutritional and metabolic disease: Secondary | ICD-10-CM

## 2022-03-18 DIAGNOSIS — E785 Hyperlipidemia, unspecified: Secondary | ICD-10-CM | POA: Diagnosis not present

## 2022-03-18 LAB — CBC WITH DIFFERENTIAL/PLATELET
Basophils Absolute: 0.1 10*3/uL (ref 0.0–0.1)
Basophils Relative: 1.2 % (ref 0.0–3.0)
Eosinophils Absolute: 0.1 10*3/uL (ref 0.0–0.7)
Eosinophils Relative: 2.5 % (ref 0.0–5.0)
HCT: 39 % (ref 36.0–46.0)
Hemoglobin: 13.3 g/dL (ref 12.0–15.0)
Lymphocytes Relative: 39.4 % (ref 12.0–46.0)
Lymphs Abs: 1.9 10*3/uL (ref 0.7–4.0)
MCHC: 34 g/dL (ref 30.0–36.0)
MCV: 91.6 fl (ref 78.0–100.0)
Monocytes Absolute: 0.4 10*3/uL (ref 0.1–1.0)
Monocytes Relative: 8.9 % (ref 3.0–12.0)
Neutro Abs: 2.4 10*3/uL (ref 1.4–7.7)
Neutrophils Relative %: 48 % (ref 43.0–77.0)
Platelets: 322 10*3/uL (ref 150.0–400.0)
RBC: 4.26 Mil/uL (ref 3.87–5.11)
RDW: 13 % (ref 11.5–15.5)
WBC: 4.9 10*3/uL (ref 4.0–10.5)

## 2022-03-18 LAB — HEMOGLOBIN A1C: Hgb A1c MFr Bld: 6.5 % (ref 4.6–6.5)

## 2022-03-18 LAB — ALT: ALT: 12 U/L (ref 0–35)

## 2022-03-18 LAB — AST: AST: 18 U/L (ref 0–37)

## 2022-03-18 MED ORDER — LEVOCETIRIZINE DIHYDROCHLORIDE 5 MG PO TABS: 5.0000 mg | ORAL_TABLET | Freq: Every evening | ORAL | 3 refills | Status: DC

## 2022-03-18 NOTE — Assessment & Plan Note (Signed)
Prediabetes: Doing well with diet, check A1c HTN: No recent ambulatory BPs, BP today is normal, continue Losartan, last BMP okay. Hyperlipidemia: Controlled on, on Pravachol. History of Hashimoto: Last TFTs normal Increased LFTs:Minimal increase ALT, does not take Tylenol.  Recheck Stress, difficulty sleeping: see LOV, less stress at work, mild difficulty sleeping sometimes. Allergies: RF antihistaminics. Preventive care: Recommend COVID and flu test. RTC 08-2018 for CPE

## 2022-03-18 NOTE — Patient Instructions (Addendum)
Recommend to proceed with covid booster (bivalent) at your pharmacy.  Veterinary surgeon) Flu shot this fall.   GO TO THE LAB : Get the blood work     [facility], PLEASE SCHEDULE YOUR APPOINTMENTS Come back for a physical exam by 08/2022

## 2022-03-18 NOTE — Progress Notes (Signed)
   Subjective:    Patient ID: Wanda Herman, female    DOB: 15-May-1971, 51 y.o.   MRN: [id number]  DOS:  03/18/2022 Type of visit - description: ROV  Since the last office visit, she is doing well and has no major concerns. Still have occasionally difficulty with sleeping. Last LFTs were slightly elevated, does not take Tylenol.  Review of Systems See above   Past Medical History:  Diagnosis Date   Breast mass, left 07/08/06   Breast mass, right 05/30/04   Fibroid 08/21/2009   Gestational diabetes 2005   Goiter    H/O candidiasis    H/O varicella    Headache(784.0)    Hypertension    Prediabetes 09/30/2014   Thyroiditis, autoimmune     Past Surgical History:  Procedure Laterality Date   ABDOMINAL HYSTERECTOMY  12/04/2011   HYSTERECTOMY ABDOMINAL; NO Oophorectomy---  Surgeon: Delice Lesch, MD;  Location: [facility];  Service: Gynecology;  Laterality: N/A;   CYSTOSCOPY  12/04/2011   Procedure: CYSTOSCOPY;  Surgeon: Delice Lesch, MD;  Location: [facility];  Service: Gynecology;  Laterality: N/A;   LASIK      Current Outpatient Medications  Medication Instructions   azelastine (ASTELIN) 0.1 % nasal spray 2 sprays, Each Nare, At bedtime PRN   cycloSPORINE (RESTASIS) 0.05 % ophthalmic emulsion 1 drop, 2 times daily   famotidine (PEPCID) 40 MG tablet TAKE 1 TABLET DAILY   levocetirizine (XYZAL) 5 mg, Oral, Every evening   losartan (COZAAR) 25 MG tablet TAKE 1 TABLET DAILY   pravastatin (PRAVACHOL) 20 MG tablet TAKE 1 TABLET DAILY       Objective:   Physical Exam BP 122/84   Pulse (!) 54   Temp 98.1 F (36.7 C) (Oral)   Resp 16   Ht '5\' 3"'$  (1.6 m)   Wt 174 lb 4 oz (79 kg)   LMP 11/22/2011   SpO2 97%   BMI 30.87 kg/m  General:   Well developed, NAD, BMI noted. HEENT:  Normocephalic . Face symmetric, atraumatic Lungs:  CTA B Normal respiratory effort, no intercostal retractions, no accessory muscle use. Heart: RRR,  no murmur.  Lower extremities: no pretibial  edema bilaterally  Skin: Not pale. Not jaundice Neurologic:  alert & oriented X3.  Speech normal, gait appropriate for age and unassisted Psych--  Cognition and judgment appear intact.  Cooperative with normal attention span and concentration.  Behavior appropriate. No anxious or depressed appearing.      Assessment    Assessment Prediabetes -- metformin intolerant, gi sx. Self d/c 2016 HTN Hyperlipidemia Reactive airway dz? Never rx a inhaler as off 01-2016 Thyroid: h/o Hashimoto, saw endo 10/2020, rec TFT q year and f/u prn    Migraines  Dyspepsia -- on zantac Dry eyes-- sees eye doctor x 2/ year  Allergies   PLAN: Prediabetes: Doing well with diet, check A1c HTN: No recent ambulatory BPs, BP today is normal, continue Losartan, last BMP okay. Hyperlipidemia: Controlled on, on Pravachol. History of Hashimoto: Last TFTs normal Increased LFTs:Minimal increase ALT, does not take Tylenol.  Recheck Stress, difficulty sleeping: see LOV, less stress at work, mild difficulty sleeping sometimes. Allergies: RF antihistaminics. Preventive care: Recommend COVID and flu test. RTC 08-2018 for CPE

## 2022-07-17 DIAGNOSIS — Z01419 Encounter for gynecological examination (general) (routine) without abnormal findings: Secondary | ICD-10-CM | POA: Diagnosis not present

## 2022-07-17 DIAGNOSIS — N6310 Unspecified lump in the right breast, unspecified quadrant: Secondary | ICD-10-CM | POA: Diagnosis not present

## 2022-07-17 DIAGNOSIS — Z1231 Encounter for screening mammogram for malignant neoplasm of breast: Secondary | ICD-10-CM | POA: Diagnosis not present

## 2022-07-23 DIAGNOSIS — N6001 Solitary cyst of right breast: Secondary | ICD-10-CM | POA: Diagnosis not present

## 2022-07-23 DIAGNOSIS — R92333 Mammographic heterogeneous density, bilateral breasts: Secondary | ICD-10-CM | POA: Diagnosis not present

## 2022-07-23 DIAGNOSIS — N6311 Unspecified lump in the right breast, upper outer quadrant: Secondary | ICD-10-CM | POA: Diagnosis not present

## 2022-07-23 LAB — HM MAMMOGRAPHY

## 2022-08-01 DIAGNOSIS — N6001 Solitary cyst of right breast: Secondary | ICD-10-CM | POA: Diagnosis not present

## 2022-08-28 DIAGNOSIS — N6001 Solitary cyst of right breast: Secondary | ICD-10-CM | POA: Diagnosis not present

## 2022-09-09 ENCOUNTER — Ambulatory Visit (INDEPENDENT_AMBULATORY_CARE_PROVIDER_SITE_OTHER): Payer: BC Managed Care – PPO | Admitting: Family Medicine

## 2022-09-09 ENCOUNTER — Encounter: Payer: Self-pay | Admitting: Family Medicine

## 2022-09-09 VITALS — BP 143/88 | HR 78 | Temp 98.1°F | Resp 16 | Ht 63.0 in | Wt 172.4 lb

## 2022-09-09 DIAGNOSIS — J0140 Acute pansinusitis, unspecified: Secondary | ICD-10-CM | POA: Diagnosis not present

## 2022-09-09 MED ORDER — AMOXICILLIN-POT CLAVULANATE 875-125 MG PO TABS: 1.0000 | ORAL_TABLET | Freq: Two times a day (BID) | ORAL | 0 refills | Status: DC

## 2022-09-09 NOTE — Patient Instructions (Addendum)
Treating for sinusitis. Nausea/vomiting episodes possibly related to significant postnasal drainage.  Continue supportive measures including rest, hydration, humidifier use, steam showers, warm compresses to sinuses, warm liquids with lemon and honey, and over-the-counter cough, cold, and analgesics as needed.   BP is high today. If you can monitor at home occasionally, please do. Otherwise, let's have a 2-week nurse visit to recheck. Take your meds as prescribed and stay well hydrated.  Please contact office for follow-up if symptoms do not improve or worsen. Seek emergency care if symptoms become severe.

## 2022-09-09 NOTE — Progress Notes (Signed)
Acute Office Visit  Subjective:     Patient ID: Wanda Herman, female    DOB: [birth year], 52 y.o.   MRN: [id number]  Chief Complaint  Patient presents with   Nausea   Emesis     Patient is in today for sinusitis, nausea/vomiting.   Patient states that she hasn't felt well since the last week of December. States she would have some URI/sinus symptoms, then feel better for awhile, then get worse again, and repeat the cycle. States she has been working a lot, lots of stress, very tired. About a week and a half again she had a few episodes of vomiting, but within 24 hours she felt better again. She has continued with some sinus pressure, bilateral ear pressure, headaches, nasal congestion, post nasal drainage. Yesterday she had another episode of vomiting, but has not any more associated nausea and no bowel changes or abdominal pain. She has had some sneezing, but no coughing. No fevers, chills, chest pain, dyspnea. No known sick contacts.      All review of systems negative except what is listed in the HPI      Objective:    BP (!) 143/88   Pulse 78   Temp 98.1 F (36.7 C)   Resp 16   Ht '5\' 3"'$  (1.6 m)   Wt 172 lb 6.4 oz (78.2 kg)   LMP 11/22/2011   SpO2 96%   BMI 30.54 kg/m    Physical Exam Vitals reviewed.  Constitutional:      Appearance: Normal appearance.  HENT:     Head: Normocephalic and atraumatic.     Right Ear: A middle ear effusion is present.     Left Ear: A middle ear effusion is present.     Nose: Congestion present.     Mouth/Throat:     Mouth: Mucous membranes are moist.     Pharynx: Oropharynx is clear.  Eyes:     Extraocular Movements: Extraocular movements intact.     Conjunctiva/sclera: Conjunctivae normal.  Cardiovascular:     Rate and Rhythm: Normal rate and regular rhythm.     Pulses: Normal pulses.     Heart sounds: Normal heart sounds.  Pulmonary:     Effort: Pulmonary effort is normal.     Breath sounds: Normal breath sounds.   Musculoskeletal:     Cervical back: Normal range of motion and neck supple. No tenderness.  Lymphadenopathy:     Cervical: No cervical adenopathy.  Skin:    General: Skin is warm and dry.  Neurological:     Mental Status: She is alert and oriented to person, place, and time.  Psychiatric:        Mood and Affect: Mood normal.        Behavior: Behavior normal.        Thought Content: Thought content normal.        Judgment: Judgment normal.        No results found for any visits on 09/09/22.      Assessment & Plan:   Problem List Items Addressed This Visit   None Visit Diagnoses     Acute non-recurrent pansinusitis    -  Primary Treating for sinusitis - Augmentin . Nausea/vomiting episodes possibly related to significant postnasal drainage.  Continue supportive measures including rest, hydration, humidifier use, steam showers, warm compresses to sinuses, warm liquids with lemon and honey, and over-the-counter cough, cold, and analgesics as needed.   BP is high today. If you can  monitor at home occasionally, please do. Otherwise, let's have a 2-week nurse visit to recheck. Take your meds as prescribed and stay well hydrated.  Please contact office for follow-up if symptoms do not improve or worsen. Seek emergency care if symptoms become severe.     Relevant Medications   amoxicillin-clavulanate (AUGMENTIN) 875-125 MG tablet       Meds ordered this encounter  Medications   amoxicillin-clavulanate (AUGMENTIN) 875-125 MG tablet    Sig: Take 1 tablet by mouth 2 (two) times daily.    Dispense:  20 tablet    Refill:  0    Order Specific Question:   Supervising Provider    Answer:   Penni Homans A [[id number]]    Return in about 2 weeks (around 09/23/2022) for BP check with nurse.  Terrilyn Saver, NP

## 2022-09-18 DIAGNOSIS — L821 Other seborrheic keratosis: Secondary | ICD-10-CM | POA: Diagnosis not present

## 2022-09-18 DIAGNOSIS — D229 Melanocytic nevi, unspecified: Secondary | ICD-10-CM | POA: Diagnosis not present

## 2022-09-18 DIAGNOSIS — L814 Other melanin hyperpigmentation: Secondary | ICD-10-CM | POA: Diagnosis not present

## 2022-09-18 DIAGNOSIS — D1801 Hemangioma of skin and subcutaneous tissue: Secondary | ICD-10-CM | POA: Diagnosis not present

## 2022-09-18 DIAGNOSIS — D1723 Benign lipomatous neoplasm of skin and subcutaneous tissue of right leg: Secondary | ICD-10-CM | POA: Diagnosis not present

## 2022-09-23 ENCOUNTER — Encounter: Payer: Self-pay | Admitting: Internal Medicine

## 2022-09-24 ENCOUNTER — Ambulatory Visit (INDEPENDENT_AMBULATORY_CARE_PROVIDER_SITE_OTHER): Payer: BC Managed Care – PPO | Admitting: Internal Medicine

## 2022-09-24 ENCOUNTER — Encounter: Payer: Self-pay | Admitting: Internal Medicine

## 2022-09-24 VITALS — BP 130/82 | HR 72 | Temp 98.1°F | Resp 16 | Ht 63.0 in | Wt 173.4 lb

## 2022-09-24 DIAGNOSIS — Z0000 Encounter for general adult medical examination without abnormal findings: Secondary | ICD-10-CM

## 2022-09-24 DIAGNOSIS — E119 Type 2 diabetes mellitus without complications: Secondary | ICD-10-CM | POA: Diagnosis not present

## 2022-09-24 DIAGNOSIS — I10 Essential (primary) hypertension: Secondary | ICD-10-CM | POA: Diagnosis not present

## 2022-09-24 DIAGNOSIS — E063 Autoimmune thyroiditis: Secondary | ICD-10-CM

## 2022-09-24 DIAGNOSIS — E8940 Asymptomatic postprocedural ovarian failure: Secondary | ICD-10-CM | POA: Diagnosis not present

## 2022-09-24 LAB — CBC WITH DIFFERENTIAL/PLATELET
Basophils Absolute: 0.1 10*3/uL (ref 0.0–0.1)
Basophils Relative: 1.3 % (ref 0.0–3.0)
Eosinophils Absolute: 0.2 10*3/uL (ref 0.0–0.7)
Eosinophils Relative: 4.3 % (ref 0.0–5.0)
HCT: 38.6 % (ref 36.0–46.0)
Hemoglobin: 13.4 g/dL (ref 12.0–15.0)
Lymphocytes Relative: 43.6 % (ref 12.0–46.0)
Lymphs Abs: 2.3 10*3/uL (ref 0.7–4.0)
MCHC: 34.7 g/dL (ref 30.0–36.0)
MCV: 89.9 fl (ref 78.0–100.0)
Monocytes Absolute: 0.4 10*3/uL (ref 0.1–1.0)
Monocytes Relative: 7.2 % (ref 3.0–12.0)
Neutro Abs: 2.3 10*3/uL (ref 1.4–7.7)
Neutrophils Relative %: 43.6 % (ref 43.0–77.0)
Platelets: 358 10*3/uL (ref 150.0–400.0)
RBC: 4.3 Mil/uL (ref 3.87–5.11)
RDW: 13 % (ref 11.5–15.5)
WBC: 5.3 10*3/uL (ref 4.0–10.5)

## 2022-09-24 LAB — TSH: TSH: 2.02 u[IU]/mL (ref 0.35–5.50)

## 2022-09-24 LAB — COMPREHENSIVE METABOLIC PANEL
ALT: 21 U/L (ref 0–35)
AST: 22 U/L (ref 0–37)
Albumin: 4.8 g/dL (ref 3.5–5.2)
Alkaline Phosphatase: 62 U/L (ref 39–117)
BUN: 13 mg/dL (ref 6–23)
CO2: 25 mEq/L (ref 19–32)
Calcium: 9.3 mg/dL (ref 8.4–10.5)
Chloride: 101 mEq/L (ref 96–112)
Creatinine, Ser: 0.75 mg/dL (ref 0.40–1.20)
GFR: 91.75 mL/min (ref >60.00)
Glucose, Bld: 127 mg/dL — ABNORMAL HIGH (ref 70–99)
Potassium: 4.5 mEq/L (ref 3.5–5.1)
Sodium: 139 mEq/L (ref 135–145)
Total Bilirubin: 0.3 mg/dL (ref 0.2–1.2)
Total Protein: 7.3 g/dL (ref 6.0–8.3)

## 2022-09-24 LAB — LIPID PANEL
Cholesterol: 186 mg/dL (ref 0–200)
HDL: 64.6 mg/dL (ref >39.00)
LDL Cholesterol: 107 mg/dL — ABNORMAL HIGH (ref 0–99)
NonHDL: 121
Total CHOL/HDL Ratio: 3
Triglycerides: 69 mg/dL (ref 0.0–149.0)
VLDL: 13.8 mg/dL (ref 0.0–40.0)

## 2022-09-24 LAB — T3, FREE: T3, Free: 3.4 pg/mL (ref 2.3–4.2)

## 2022-09-24 LAB — HEMOGLOBIN A1C: Hgb A1c MFr Bld: 6.8 % — ABNORMAL HIGH (ref 4.6–6.5)

## 2022-09-24 LAB — VITAMIN D 25 HYDROXY (VIT D DEFICIENCY, FRACTURES): VITD: 26.92 ng/mL — ABNORMAL LOW (ref 30.00–100.00)

## 2022-09-24 LAB — T4, FREE: Free T4: 0.7 ng/dL (ref 0.60–1.60)

## 2022-09-24 NOTE — Assessment & Plan Note (Signed)
-  Tdap:2015  -  Shingrex x2. -COVID vaccine:  booster rec  - Had a flu shot --Female care per Dr Mancel Bale.  MMG: 07-2022  (K PN). She has some menopausal symptoms on and off, recommend to discuss with gynecology. --CCS: Had a colonoscopy 08/04/2020. Next in 10 years  --Diet and exercise: Room for improvement on diet, this was discussed, encouraged a lower carbohydrate intake and portion control.  Has not been exercising much lately, encouraged to go back to a routine. -Labs: CMP FLP A1c vitamin D TFTs.

## 2022-09-24 NOTE — Progress Notes (Signed)
Subjective:    Patient ID: Wanda Herman, female    DOB: 14-Sep-1970, 52 y.o.   MRN: [id number]  DOS:  09/24/2022 Type of visit - description: CPX  Here for CPX Had respiratory symptoms during December and January, was seen here and  prescribed antibiotics, feeling better. Report on and off hot flashes, they are bothering her sleep she feels somewhat tired.  No fever chills or weight loss.  Review of Systems  Other than above, a 14 point review of systems is negative     Past Medical History:  Diagnosis Date   Breast mass, left 07/08/06   Breast mass, right 05/30/04   Fibroid 08/21/2009   Gestational diabetes 2005   Goiter    H/O candidiasis    H/O varicella    Headache(784.0)    Hypertension    Prediabetes 09/30/2014   Thyroiditis, autoimmune     Past Surgical History:  Procedure Laterality Date   ABDOMINAL HYSTERECTOMY  12/04/2011   HYSTERECTOMY ABDOMINAL; NO Oophorectomy---  Surgeon: Delice Lesch, MD;  Location: [facility];  Service: Gynecology;  Laterality: N/A;   CYSTOSCOPY  12/04/2011   Procedure: CYSTOSCOPY;  Surgeon: Delice Lesch, MD;  Location: [facility];  Service: Gynecology;  Laterality: N/A;   LASIK     Social History   Socioeconomic History   Marital status: Married    Spouse name: Not on file   Number of children: 2   Years of education: Not on file   Highest education level: Not on file  Occupational History   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   Younger daughter lives w/ them   Social Determinants of Health   Financial Resource Strain: Not on file  Food Insecurity: Not on  file  Transportation Needs: Not on file  Physical Activity: Not on file  Stress: Not on file  Social Connections: Not on file  Intimate Partner Violence: Not on file     Current Outpatient Medications  Medication Instructions   azelastine (ASTELIN) 0.1 % nasal spray 2 sprays, Each Nare, At bedtime PRN   cycloSPORINE (RESTASIS) 0.05 % ophthalmic emulsion 1 drop, 2 times daily   famotidine (PEPCID) 40 MG tablet TAKE 1 TABLET DAILY   levocetirizine (XYZAL) 5 mg, Oral, Every evening   losartan (COZAAR) 25 MG tablet TAKE 1 TABLET DAILY   pravastatin (PRAVACHOL) 20 MG tablet TAKE 1 TABLET DAILY       Objective:   Physical Exam BP 130/82   Pulse 72   Temp 98.1 F (36.7 C) (Oral)   Resp 16   Ht '5\' 3"'$  (1.6 m)   Wt 173 lb 6 oz (78.6 kg)   LMP 11/22/2011   SpO2 98%   BMI 30.71 kg/m  General: Well developed, NAD, BMI noted Neck: No  thyromegaly  HEENT:  Normocephalic . Face symmetric, atraumatic Lungs:  CTA B Normal respiratory effort, no intercostal retractions, no accessory muscle use. Heart: RRR,  no murmur.  Abdomen:  Not distended, soft, non-tender. No rebound or rigidity.  Small diastases recti noted  Lower extremities: no pretibial edema bilaterally  Skin: Exposed areas without rash. Not pale. Not jaundice Neurologic:  alert & oriented X3.  Speech normal, gait appropriate for age and unassisted Strength symmetric and appropriate for age.  Psych: Cognition and judgment appear intact.  Cooperative with normal attention span and concentration.  Behavior appropriate. No anxious or depressed appearing.     Assessment     Assessment DM-- metformin intolerant, gi sx. Self d/c 2016 HTN Hyperlipidemia Reactive airway dz? Never rx a inhaler as off 01-2016 Thyroid: h/o Hashimoto, saw endo 10/2020, rec TFT q year and f/u prn    Migraines  Dyspepsia -- on zantac Dry eyes-- sees eye doctor x 2/ year  Allergies  PLAN: Here for CPX DM: Metformin intolerant, diet  controlled, check A1c HTN: On losartan, no amb BPs but whenever she visits the doctor BPs are normal. Hyperlipidemia: On Pravachol, last LDL 107, ideal LDL 100.  Checking labs History of Hashimoto: Checking TFTs. Diastasis recti: See physical exam, this was explained to the patient. RTC 1 year

## 2022-09-24 NOTE — Patient Instructions (Addendum)
Vaccines I recommend:  Covid booster  Check the  blood pressure regularly BP GOAL is between 110/65 and  135/85. If it is consistently higher or lower, let me know     GO TO THE LAB : Get the blood work     [place], [place] Come back for   physical exam in 1 year.   Because your blood sugar is recommended you check with your eye doctor every year. Please have them send copies of your office visit notes to [place]. Our fax number is [phone]. If you need a referral to an eye doctor please let us know.

## 2022-09-24 NOTE — Assessment & Plan Note (Signed)
Here for CPX DM: Metformin intolerant, diet controlled, check A1c HTN: On losartan, no amb BPs but whenever she visits the doctor BPs are normal. Hyperlipidemia: On Pravachol, last LDL 107, ideal LDL 100.  Checking labs History of Hashimoto: Checking TFTs. Diastasis recti: See physical exam, this was explained to the patient. RTC 1 year

## 2022-09-25 ENCOUNTER — Ambulatory Visit: Payer: BC Managed Care – PPO

## 2022-09-26 MED ORDER — METFORMIN HCL 500 MG PO TABS: ORAL_TABLET | ORAL | 0 refills | Status: DC

## 2022-09-26 MED ORDER — VITAMIN D (ERGOCALCIFEROL) 1.25 MG (50000 UNIT) PO CAPS: 50000.0000 [IU] | ORAL_CAPSULE | ORAL | 0 refills | Status: DC

## 2022-09-26 NOTE — Addendum Note (Signed)
Addended byDamita Dunnings D on: 09/26/2022 04:12 PM   Modules accepted: Orders

## 2022-10-02 DIAGNOSIS — Z809 Family history of malignant neoplasm, unspecified: Secondary | ICD-10-CM | POA: Diagnosis not present

## 2022-11-26 ENCOUNTER — Encounter: Payer: Self-pay | Admitting: Internal Medicine

## 2022-12-02 ENCOUNTER — Encounter: Payer: Self-pay | Admitting: *Deleted

## 2023-01-07 ENCOUNTER — Ambulatory Visit: Payer: BC Managed Care – PPO | Admitting: Internal Medicine

## 2023-01-07 ENCOUNTER — Encounter: Payer: Self-pay | Admitting: Internal Medicine

## 2023-01-08 ENCOUNTER — Ambulatory Visit (INDEPENDENT_AMBULATORY_CARE_PROVIDER_SITE_OTHER): Payer: BC Managed Care – PPO | Admitting: Internal Medicine

## 2023-01-08 ENCOUNTER — Encounter: Payer: Self-pay | Admitting: Internal Medicine

## 2023-01-08 VITALS — BP 126/70 | HR 75 | Temp 98.1°F | Resp 16 | Ht 63.0 in | Wt 172.2 lb

## 2023-01-08 DIAGNOSIS — M6208 Separation of muscle (nontraumatic), other site: Secondary | ICD-10-CM | POA: Diagnosis not present

## 2023-01-08 DIAGNOSIS — E119 Type 2 diabetes mellitus without complications: Secondary | ICD-10-CM | POA: Diagnosis not present

## 2023-01-08 DIAGNOSIS — J3089 Other allergic rhinitis: Secondary | ICD-10-CM | POA: Diagnosis not present

## 2023-01-08 DIAGNOSIS — Z7984 Long term (current) use of oral hypoglycemic drugs: Secondary | ICD-10-CM

## 2023-01-08 LAB — ALT: ALT: 19 U/L (ref 0–35)

## 2023-01-08 LAB — MICROALBUMIN / CREATININE URINE RATIO
Creatinine,U: 140.3 mg/dL
Microalb Creat Ratio: 0.5 mg/g (ref 0.0–30.0)
Microalb, Ur: <0.7 mg/dL (ref 0.0–1.9)

## 2023-01-08 LAB — AST: AST: 23 U/L (ref 0–37)

## 2023-01-08 LAB — HEMOGLOBIN A1C: Hgb A1c MFr Bld: 6.4 % (ref 4.6–6.5)

## 2023-01-08 NOTE — Progress Notes (Unsigned)
Subjective:    Patient ID: Wanda Herman, female    DOB: 11-08-1970, 52 y.o.   MRN: [id number]  DOS:  01/08/2023 Type of visit - description: Routine checkup  Is a last office visit, started metformin for A1c of 6.8. He is having some side effects, including nausea and diarrhea, they are gradually getting slightly better. For improvement on exercise, diet has improved.  We also talk about allergies.  Referral?.  Diastasis recti discussed as well.    Review of Systems See above   Past Medical History:  Diagnosis Date   Breast mass, left 07/08/06   Breast mass, right 05/30/04   Fibroid 08/21/2009   Gestational diabetes 2005   Goiter    H/O candidiasis    H/O varicella    Headache(784.0)    Hypertension    Prediabetes 09/30/2014   Thyroiditis, autoimmune     Past Surgical History:  Procedure Laterality Date   ABDOMINAL HYSTERECTOMY  12/04/2011   HYSTERECTOMY ABDOMINAL; NO Oophorectomy---  Surgeon: Purcell Nails, MD;  Location: [facility];  Service: Gynecology;  Laterality: N/A;   CYSTOSCOPY  12/04/2011   Procedure: CYSTOSCOPY;  Surgeon: Purcell Nails, MD;  Location: [facility];  Service: Gynecology;  Laterality: N/A;   LASIK      Current Outpatient Medications  Medication Instructions   azelastine (ASTELIN) 0.1 % nasal spray 2 sprays, Each Nare, At bedtime PRN   cycloSPORINE (RESTASIS) 0.05 % ophthalmic emulsion 1 drop, 2 times daily   famotidine (PEPCID) 40 MG tablet TAKE 1 TABLET DAILY   levocetirizine (XYZAL) 5 mg, Oral, Every evening   losartan (COZAAR) 25 MG tablet TAKE 1 TABLET DAILY   metFORMIN (GLUCOPHAGE) 500 MG tablet Take 1 tablet by mouth daily for 10 days, then 1 tablet by mouth twice daily   pravastatin (PRAVACHOL) 20 MG tablet TAKE 1 TABLET DAILY   Vitamin D (Ergocalciferol) (DRISDOL) 50,000 Units, Oral, Every 7 days       Objective:   Physical Exam BP 126/70   Pulse 75   Temp 98.1 F (36.7 C) (Oral)   Resp 16   Ht 5\' 3"  (1.6 m)   Wt 172 lb 4  oz (78.1 kg)   LMP 11/22/2011   SpO2 97%   BMI 30.51 kg/m  General:   Well developed, NAD, BMI noted. HEENT:  Normocephalic . Face symmetric, atraumatic Nose: Slightly congested. TMs slightly bulged but not red. Abdomen: Diathesis recti noted. DM foot exam: No edema, normal pedal pulses, pinprick examination normal. Skin: Not pale. Not jaundice Neurologic:  alert & oriented X3.  Speech normal, gait appropriate for age and unassisted Psych--  Cognition and judgment appear intact.  Cooperative with normal attention span and concentration.  Behavior appropriate. No anxious or depressed appearing.      Assessment     Assessment DM-- metformin intolerant, gi sx. Self d/c 2016 HTN Hyperlipidemia Reactive airway dz? Never rx a inhaler as off 01-2016 Thyroid: h/o Hashimoto, saw endo 10/2020, rec TFT q year and f/u prn    Migraines  Dyspepsia -- on zantac Dry eyes-- sees eye doctor x 2/ year  Allergies  PLAN: DM: Last A1c 6.8, started metformin 500 mg twice daily, having some side effects like she did in 2016 but they are diminishing. We had a long conversation about treatment options, once we have the A1c back we will consider switching to metformin XR 500 mg twice daily.  Other options in the future are pioglitazone and Rybelsus.  Patient is  aware. Long conversation about the need to exercise regularly.  She is doing better with diet. Feet exam negative. Vitamin D deficiency: Had ergocalciferol, recommend to take 2000 units daily. Diastasis recti: Again discussed the issue, typically there is no need for surgical intervention. Allergies: Has allergies year-round, still symptomatic despite using antihistaminics, nasal sprays and eyedrops.  Referred to an allergist.   R breast lump, saw surgery 08/28/2022, she was s/p  successful aspiration of a cyst , seemed resolved. They recommended to follow-up 02-2023. RTC 4 months.   Time spent with the patient 30 minutes, extensive  discussion about pharmacological treatment of DM, options.  Also long discussion about the disease recti, what that means.

## 2023-01-08 NOTE — Patient Instructions (Addendum)
Continue metformin as you are doing. Once I have the results, we could switch you to metformin extended release.  There are options to metformin in case you would like to change medications in the future. Pioglitazone Rybelsus  Take vitamin D 2000 units daily    GO TO THE LAB : Get the blood work     GO TO THE FRONT DESK, PLEASE SCHEDULE YOUR APPOINTMENTS Come back for checkup in 4 months   Vaccines I recommend: Covid booster  Per our records you are due for your diabetic eye exam. Please contact your eye doctor to schedule an appointment. Please have them send copies of your office visit notes to [place]. Our fax number is [phone]. If you need a referral to an eye doctor please let us know.

## 2023-01-09 MED ORDER — METFORMIN HCL ER 500 MG PO TB24: 500.0000 mg | ORAL_TABLET | Freq: Two times a day (BID) | ORAL | 1 refills | Status: DC

## 2023-01-09 NOTE — Addendum Note (Signed)
Addended byConrad Edina D on: 01/09/2023 12:29 PM   Modules accepted: Orders

## 2023-01-09 NOTE — Assessment & Plan Note (Signed)
DM: Last A1c 6.8, started metformin 500 mg twice daily, having some side effects like she did in 2016 but they are diminishing. We had a long conversation about treatment options, once we have the A1c back we will consider switching to metformin XR 500 mg twice daily.  Other options in the future are pioglitazone and Rybelsus.  Patient is aware. Long conversation about the need to exercise regularly.  She is doing better with diet. Feet exam negative. Vitamin D deficiency: Had ergocalciferol, recommend to take 2000 units daily. Diastasis recti: Again discussed the issue, typically there is no need for surgical intervention. Allergies: Has allergies year-round, still symptomatic despite using antihistaminics, nasal sprays and eyedrops.  Referred to an allergist. R breast lump, saw surgery 08/28/2022, she was s/p  successful aspiration of a cyst , seemed resolved. They rec a f/u 02-2023. RTC 4 months.

## 2023-01-29 ENCOUNTER — Other Ambulatory Visit: Payer: Self-pay | Admitting: Internal Medicine

## 2023-03-10 NOTE — Progress Notes (Unsigned)
NEW PATIENT Date of Service/Encounter:  03/12/23 Referring provider: Wanda Plump, MD Primary care provider: Wanda Plump, MD  Subjective:  Wanda Herman is a 52 y.o. female with a PMHx of DM, Hashimoto thyroiditis, migraines HTN, uterine leiomyoma presenting today for evaluation of chronic rhinitis History obtained from: chart review and patient.   Chronic rhinitis:  Symptoms include:  facial pressure, ear pressure, dry eyes, sneezing, and itchy eyes  Occurs year-round, used to be mostly fall and spring Potential triggers: pollen, and unknown.  No pets in home. Treatments tried: xyzal, cyclosporine eye drops, astelin nose spray, floanse nasal spray Previous allergy testing:  many years ago, remembers being allergic to feathers History of reflux/heartburn:  prescribed pepcid 40 mg daily. Previous sinus, ear, tonsil, adenoid surgeries: none Previous sinus imaging: none  Concern for Food Allergy:  Food of concern: shellfish History of reaction: oysters-upset stomach and vomiting, does eat shrimp and fish. Avoids all other shellfish. Previous allergy testing no Carries an epinephrine autoinjector: no  Bee sting-told by her mother she had a reaction in childhood  She does not carry an epipen. Has not been stung in adulthood.  Chart Review:  Reviewed PCP notes from referral 01/08/23: "Allergies: Has allergies year-round, still symptomatic despite using antihistaminics, nasal sprays and eyedrops.  Referred to an allergist. "  Other allergy screening: Asthma: no-does have occasional difficulties breathing when younger and did play sports, she does not have an inhaler, occasionally will have these symptoms but is not exercising often. Does remember having wheezing when younger.  Will occasionally have wheezing if taking the stairs.  She holds herself back from exercise to avoid these symptoms. Medication allergy: no Eczema:no History of recurrent infections suggestive of  immunodeficency: no Vaccinations are up to date.   Past Medical History: Past Medical History:  Diagnosis Date   Breast mass, left 07/08/06   Breast mass, right 05/30/04   Fibroid 08/21/2009   Gestational diabetes 2005   Goiter    H/O candidiasis    H/O varicella    Headache(784.0)    Hypertension    Prediabetes 09/30/2014   Thyroiditis, autoimmune    Medication List:  Current Outpatient Medications  Medication Sig Dispense Refill   azelastine (ASTELIN) 0.1 % nasal spray Place 2 sprays into both nostrils at bedtime as needed for rhinitis. 30 mL 3   cholecalciferol (VITAMIN D3) 25 MCG (1000 UNIT) tablet Take 2,000 Units by mouth daily.     cycloSPORINE (RESTASIS) 0.05 % ophthalmic emulsion 1 drop 2 (two) times daily.     famotidine (PEPCID) 40 MG tablet Take 1 tablet (40 mg total) by mouth daily. 90 tablet 1   levocetirizine (XYZAL) 5 MG tablet Take 1 tablet (5 mg total) by mouth every evening. 90 tablet 3   losartan (COZAAR) 25 MG tablet Take 1 tablet (25 mg total) by mouth daily. 90 tablet 1   metFORMIN (GLUCOPHAGE-XR) 500 MG 24 hr tablet Take 1 tablet (500 mg total) by mouth 2 (two) times daily with a meal. 180 tablet 1   pravastatin (PRAVACHOL) 20 MG tablet Take 1 tablet (20 mg total) by mouth daily. 90 tablet 1   No current facility-administered medications for this visit.   Known Allergies:  No Known Allergies Past Surgical History: Past Surgical History:  Procedure Laterality Date   ABDOMINAL HYSTERECTOMY  12/04/2011   HYSTERECTOMY ABDOMINAL; NO Oophorectomy---  Surgeon: Purcell Nails, MD;  Location: [facility];  Service: Gynecology;  Laterality: N/A;   CYSTOSCOPY  12/04/2011   Procedure: CYSTOSCOPY;  Surgeon: Purcell Nails, MD;  Location: [facility];  Service: Gynecology;  Laterality: N/A;   LASIK     Family History: Family History  Problem Relation Age of Onset   Allergic rhinitis Mother    Diabetes Mother    Hypertension Mother    Hyperlipidemia Mother    Diabetes  Father    Ovarian cancer Maternal Aunt        ovarian cancer   Diabetes Maternal Grandmother    Hypertension Maternal Grandmother    Heart attack Other        GF at 52 y/o   Colon cancer Neg Hx    Breast cancer Neg Hx    Social History: Wanda Herman lives in a house built around 30 years ago, no water damage, carpet in the bedroom, casein, central AC, no pets, no roaches, not using dust protection the bedding or pillows, no smoke exposure.  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  No HEPA filter in the home.  [place]   ROS:  All other systems negative except as noted per HPI.  Objective:  Blood pressure 128/82, pulse 72, temperature 98.1 F (36.7 C), temperature source Temporal, resp. rate 14, height 5' 4.57" (1.64 m), weight 167 lb 12.8 oz (76.1 kg), last menstrual period 11/22/2011, SpO2 99%. Body mass index is 28.3 kg/m. Physical Exam:  General Appearance:  Alert, cooperative, no distress, appears stated age  Head:  Normocephalic, without obvious abnormality, atraumatic  Eyes:  Conjunctiva clear, EOM's intact  Ears EACs normal bilaterally and normal TMs bilaterally  Nose: Nares normal, hypertrophic turbinates  Throat: Lips, tongue normal; teeth and gums normal, normal posterior oropharynx  Neck: Supple, symmetrical  Lungs:   clear to auscultation bilaterally, Respirations unlabored, no coughing  Heart:  regular rate and rhythm and no murmur, Appears well perfused  Extremities: No edema  Skin: Skin color, texture, turgor normal and no rashes or lesions on visualized portions of skin  Neurologic: No gross deficits   Diagnostics: Spirometry:  Tracings reviewed. Her effort: Good reproducible efforts.  Not currently symptomatic. FVC: 2.45L FEV1: 1.87L, 83% predicted  FEV1/FVC ratio: 0.76 Interpretation: Spirometry consistent with normal pattern.  Please see scanned spirometry results for details.  Skin Testing: Environmental allergy panel and select  foods. Adequate positive and negative controls. Results discussed with patient/family.  Airborne Adult Perc - 03/12/23 1102     Time Antigen Placed 1102    Allergen Manufacturer Waynette Buttery    Location Back    Number of Test 55    1. Control-Buffer 50% Glycerol Negative    2. Control-Histamine 3+    3. Bahia Negative    4. French Southern Territories Negative    5. Johnson Negative    6. Kentucky Blue Negative    7. Meadow Fescue Negative    8. Perennial Rye Negative    9. Timothy Negative    10. Ragweed Mix Negative    11. Cocklebur Negative    12. Plantain,  English Negative    13. Baccharis Negative    14. Dog Fennel Negative    15. Russian Thistle Negative    16. Lamb's Quarters 2+    17. Sheep Sorrell 2+    18. Rough Pigweed Negative    19. Marsh Elder, Rough Negative    20. Mugwort, Common 2+    21. Box, Elder Negative    22. Cedar, red Negative    23. Sweet Gum 2+    24.  Pecan Pollen Negative    25. Pine Mix Negative    26. Walnut, Black Pollen Negative    27. Red Mulberry Negative    28. Ash Mix Negative    29. Birch Mix 2+    30. Beech American Negative    31. Cottonwood, Guinea-Bissau Negative    32. Hickory, White Negative    33. Maple Mix Negative    34. Oak, Guinea-[place]iss[place] Mix Negative    35. Sycamore Eastern Negative    36. Alternaria Alternata Negative    37. Cladosporium Herbarum Negative    38. Aspergillus Mix Negative    39. Penicillium Mix Negative    40. Bipolaris Sorokiniana (Helminthosporium) Negative    41. Drechslera Spicifera (Curvularia) Negative    42. Mucor Plumbeus Negative    43. Fusarium Moniliforme Negative    44. Aureobasidium Pullulans (pullulara) Negative    45. Rhizopus Oryzae Negative    46. Botrytis Cinera Negative    47. Epicoccum Nigrum Negative    48. Phoma Betae Negative    49. Dust Mite Mix 3+    50. Cat Hair 10,000 BAU/ml Negative    51.  Dog Epithelia Negative    52. Mixed Feathers Negative    53. Horse Epithelia Negative    54. Cockroach, German  Negative    55. Tobacco Leaf Negative             Intradermal - 03/12/23 1208     Time Antigen Placed 1208    Allergen Manufacturer Waynette Buttery    Location Arm    Number of Test 13    Control Negative    Bahia Negative    French Southern Territories Negative    Johnson Negative    7 Grass Negative    Ragweed Mix Negative    Mold 1 Negative    Mold 2 Negative    Mold 3 Negative    Mold 4 Negative    Cat Negative    Dog Negative    Cockroach Negative             Food Adult Perc - 03/12/23 1100     Time Antigen Placed 1102    Allergen Manufacturer Greer    Location Back    Number of allergen test 5    23. Shrimp Negative    24. Crab Negative    25. Lobster Negative    26. Oyster Negative    27. Scallops Negative             Allergy testing results were read and interpreted by myself, documented by clinical staff.  Labs:  Lab Orders         Allergen Hymenoptera Panel         Allergen Profile, Shellfish         Allergens, Zone 2       Assessment and Plan  Chronic Rhinitis: determined to be Seasonal and Perennial Allergic: - allergy testing today: positive to weed pollen, tree pollen and dust mites - Prevention:  - allergen avoidance when possible - consider allergy shots as long term control of your symptoms by teaching your immune system to be more tolerant of your allergy triggers - Symptom control: - Start Ryaltris 1-2 sprays in each nostril twice a day as needed for nasal congestion/itchy nose - Continue Antihistamine: daily or daily as needed.   -Options include Zyrtec (Cetirizine) 10mg , Claritin (Loratadine) 10mg , Allegra (Fexofenadine) 180mg , or Xyzal (Levocetirinze) 5mg  - Can be purchased over-the-counter if not covered by insurance.  Allergic Conjunctivitis:  - Consider Allergy Eye drops-great options include Pataday (Olopatadine) or Zaditor (ketotifen) for eye symptoms daily as needed-both sold over the counter if not covered by insurance.  -Avoid eye drops that  say red eye relief as they may contain medications that dry out your eyes.  Adverse food reaction - today's skin testing was negative to shellfish - labs today for confirmation - please strictly avoid shellfish (except shrimp) until results return - okay to continue eating shrimp and fish - for SKIN only reaction, okay to take Benadryl 2 capsules every 4 hours - for SKIN + ANY additional symptoms, OR IF concern for LIFE THREATENING reaction = Epipen Autoinjector EpiPen 0.3 mg. - If using Epinephrine autoinjector, call 911 - A food allergy action plan has been provided and discussed. - Medic Alert identification is recommended.  Exercise intolerance-suspect exercise induced bronchospasm: - your lung testing today looked great, not symptomatic at testing - Rescue Inhaler: Albuterol (Proair/Ventolin) 2 puffs . Use  every 4-6 hours as needed for chest tightness, wheezing, or coughing.   Can also use 15 minutes prior to exercise if you have symptoms with activity. - Asthma is not controlled if:  - Symptoms are occurring >2 times a week OR  - >2 times a month nighttime awakenings  - You are requiring systemic steroids (prednisone/steroid injections) more than once per year  - Your require hospitalization for your asthma.  - Please call the clinic to schedule a follow up if these symptoms arise Avoid smoke exposure Stay up-to-date with your annual flu vaccines, COVID vaccines and pneumonia vaccines when indicated.  Adverse reaction to bee stings:  - based on today's history, will obtain labs for stinging insects - please carry Epinephrine autoinjector at all times in case of accidental sting, to be used if stung and has SKIN AND ANY OTHER SYMPTOMS - for SKIN only, can take Benadryl 2 tsp (25 mg)  - recommend medical alert bracelet - practice avoidance measures as outlined below when possible  Follow up : 3 months, sooner if needed. Sooner if decide on allergy injections-call insurance first  and then let us know if you would like to do RUSH (rapid build-up) or weekly traditional build-up It was a pleasure meeting you in clinic today! Thank you for allowing me to participate in your care.  This note in its entirety was forwarded to the Provider who requested this consultation.  Other:  none  Thank you for your kind referral. I appreciate the opportunity to take part in Wanda Herman's care. Please do not hesitate to contact me with questions.  Sincerely,  Tonny Bollman, MD [facility]

## 2023-03-12 ENCOUNTER — Ambulatory Visit (INDEPENDENT_AMBULATORY_CARE_PROVIDER_SITE_OTHER): Payer: BC Managed Care – PPO | Admitting: Internal Medicine

## 2023-03-12 ENCOUNTER — Encounter: Payer: Self-pay | Admitting: Internal Medicine

## 2023-03-12 ENCOUNTER — Other Ambulatory Visit: Payer: Self-pay

## 2023-03-12 VITALS — BP 128/82 | HR 72 | Temp 98.1°F | Resp 14 | Ht 64.57 in | Wt 167.8 lb

## 2023-03-12 DIAGNOSIS — H1013 Acute atopic conjunctivitis, bilateral: Secondary | ICD-10-CM

## 2023-03-12 DIAGNOSIS — T63441A Toxic effect of venom of bees, accidental (unintentional), initial encounter: Secondary | ICD-10-CM | POA: Diagnosis not present

## 2023-03-12 DIAGNOSIS — J302 Other seasonal allergic rhinitis: Secondary | ICD-10-CM | POA: Insufficient documentation

## 2023-03-12 DIAGNOSIS — J45990 Exercise induced bronchospasm: Secondary | ICD-10-CM | POA: Diagnosis not present

## 2023-03-12 DIAGNOSIS — T7840XA Allergy, unspecified, initial encounter: Secondary | ICD-10-CM

## 2023-03-12 DIAGNOSIS — J3089 Other allergic rhinitis: Secondary | ICD-10-CM

## 2023-03-12 DIAGNOSIS — T781XXA Other adverse food reactions, not elsewhere classified, initial encounter: Secondary | ICD-10-CM | POA: Diagnosis not present

## 2023-03-12 DIAGNOSIS — T7819XA Other adverse food reactions, not elsewhere classified, initial encounter: Secondary | ICD-10-CM | POA: Insufficient documentation

## 2023-03-12 MED ORDER — LEVOCETIRIZINE DIHYDROCHLORIDE 5 MG PO TABS
5.0000 mg | ORAL_TABLET | Freq: Every evening | ORAL | 3 refills | Status: DC
Start: 2023-03-12 — End: 2023-03-12

## 2023-03-12 MED ORDER — EPINEPHRINE 0.3 MG/0.3ML IJ SOAJ: 0.3000 mg | INTRAMUSCULAR | 2 refills | Status: AC | PRN

## 2023-03-12 MED ORDER — OLOPATADINE HCL 0.2 % OP SOLN: 1.0000 [drp] | Freq: Every day | OPHTHALMIC | 5 refills | Status: DC | PRN

## 2023-03-12 MED ORDER — LEVOCETIRIZINE DIHYDROCHLORIDE 5 MG PO TABS
5.0000 mg | ORAL_TABLET | Freq: Every evening | ORAL | 3 refills | Status: DC
Start: 2023-03-12 — End: 2024-04-29

## 2023-03-12 MED ORDER — RYALTRIS 665-25 MCG/ACT NA SUSP: 2.0000 | Freq: Two times a day (BID) | NASAL | 5 refills | Status: DC | PRN

## 2023-03-12 NOTE — Patient Instructions (Addendum)
Chronic Rhinitis: determined to be Seasonal and Perennial Allergic: - allergy testing today: positive to weed pollen, tree pollen and dust mites - Prevention:  - allergen avoidance when possible - consider allergy shots as long term control of your symptoms by teaching your immune system to be more tolerant of your allergy triggers - Symptom control: - Start Ryaltris 1-2 sprays in each nostril twice a day as needed for nasal congestion/itchy nose - Continue Antihistamine: daily or daily as needed.   -Options include Zyrtec (Cetirizine) 10mg , Claritin (Loratadine) 10mg , Allegra (Fexofenadine) 180mg , or Xyzal (Levocetirinze) 5mg  - Can be purchased over-the-counter if not covered by insurance.  Allergic Conjunctivitis:  - Consider Allergy Eye drops-great options include Pataday (Olopatadine) or Zaditor (ketotifen) for eye symptoms daily as needed-both sold over the counter if not covered by insurance.  -Avoid eye drops that say red eye relief as they may contain medications that dry out your eyes.  Adverse Food Reaction - today's skin testing was negative to shellfish - labs today for confirmation - please strictly avoid shellfish (except shrimp) until results return - okay to continue eating shrimp and fish - for SKIN only reaction, okay to take Benadryl 2 capsules every 4 hours - for SKIN + ANY additional symptoms, OR IF concern for LIFE THREATENING reaction = Epipen Autoinjector EpiPen 0.3 mg. - If using Epinephrine autoinjector, call 911 - A food allergy action plan has been provided and discussed. - Medic Alert identification is recommended.  Exercise intolerance-suspect exercise induced bronchospasm: - your lung testing today looked great, not symptomatic at testing - Rescue Inhaler: Albuterol (Proair/Ventolin) 2 puffs . Use  every 4-6 hours as needed for chest tightness, wheezing, or coughing.   Can also use 15 minutes prior to exercise if you have symptoms with activity. - Asthma  is not controlled if:  - Symptoms are occurring >2 times a week OR  - >2 times a month nighttime awakenings  - You are requiring systemic steroids (prednisone/steroid injections) more than once per year  - Your require hospitalization for your asthma.  - Please call the clinic to schedule a follow up if these symptoms arise Avoid smoke exposure Stay up-to-date with your annual flu vaccines, COVID vaccines and pneumonia vaccines when indicated.  Adverse reaction to bee stings:  - based on today's history, will obtain labs for stinging insects - please carry Epinephrine autoinjector at all times in case of accidental sting, to be used if stung and has SKIN AND ANY OTHER SYMPTOMS - for SKIN only, can take Benadryl 2 tsp (25 mg)  - recommend medical alert bracelet - practice avoidance measures as outlined below when possible    Follow up : 3 months, sooner if needed. Sooner if decide on allergy injections-call insurance first and then let us know if you would like to do RUSH (rapid build-up) or weekly traditional build-up It was a pleasure meeting you in clinic today! Thank you for allowing me to participate in your care.  Tonny Bollman, MD [facility]

## 2023-03-12 NOTE — Addendum Note (Signed)
Addended by: Verlee Monte on: 03/12/2023 12:38 PM   Modules accepted: Orders

## 2023-03-17 NOTE — Progress Notes (Signed)
Thank you Pauline

## 2023-05-14 ENCOUNTER — Ambulatory Visit: Payer: BC Managed Care – PPO | Admitting: Internal Medicine

## 2023-06-04 ENCOUNTER — Ambulatory Visit: Payer: BC Managed Care – PPO | Admitting: Internal Medicine

## 2023-06-04 ENCOUNTER — Encounter: Payer: Self-pay | Admitting: Internal Medicine

## 2023-06-04 VITALS — BP 126/80 | HR 71 | Temp 98.1°F | Resp 16 | Ht 64.5 in | Wt 174.6 lb

## 2023-06-04 DIAGNOSIS — E559 Vitamin D deficiency, unspecified: Secondary | ICD-10-CM

## 2023-06-04 DIAGNOSIS — I10 Essential (primary) hypertension: Secondary | ICD-10-CM | POA: Diagnosis not present

## 2023-06-04 DIAGNOSIS — T7840XD Allergy, unspecified, subsequent encounter: Secondary | ICD-10-CM

## 2023-06-04 DIAGNOSIS — Z7984 Long term (current) use of oral hypoglycemic drugs: Secondary | ICD-10-CM

## 2023-06-04 DIAGNOSIS — E785 Hyperlipidemia, unspecified: Secondary | ICD-10-CM | POA: Diagnosis not present

## 2023-06-04 DIAGNOSIS — E119 Type 2 diabetes mellitus without complications: Secondary | ICD-10-CM | POA: Diagnosis not present

## 2023-06-04 LAB — LIPID PANEL
Cholesterol: 195 mg/dL (ref 0–200)
HDL: 67.4 mg/dL (ref >39.00)
LDL Cholesterol: 108 mg/dL — ABNORMAL HIGH (ref 0–99)
NonHDL: 127.76
Total CHOL/HDL Ratio: 3
Triglycerides: 97 mg/dL (ref 0.0–149.0)
VLDL: 19.4 mg/dL (ref 0.0–40.0)

## 2023-06-04 LAB — BASIC METABOLIC PANEL
BUN: 11 mg/dL (ref 6–23)
CO2: 27 meq/L (ref 19–32)
Calcium: 9.6 mg/dL (ref 8.4–10.5)
Chloride: 101 meq/L (ref 96–112)
Creatinine, Ser: 0.82 mg/dL (ref 0.40–1.20)
GFR: 82.04 mL/min (ref >60.00)
Glucose, Bld: 114 mg/dL — ABNORMAL HIGH (ref 70–99)
Potassium: 4.3 meq/L (ref 3.5–5.1)
Sodium: 138 meq/L (ref 135–145)

## 2023-06-04 LAB — HEMOGLOBIN A1C: Hgb A1c MFr Bld: 6.4 % (ref 4.6–6.5)

## 2023-06-04 LAB — VITAMIN D 25 HYDROXY (VIT D DEFICIENCY, FRACTURES): VITD: 33.79 ng/mL (ref 30.00–100.00)

## 2023-06-04 NOTE — Assessment & Plan Note (Signed)
DM: Doing well with diet, has increased her vigorous physical activity, on metformin without apparent side effects, check A1c HTN: No ambulatory BPs, BP at today's excellent, continue losartan, check a BMP. High cholesterol: On Pravachol, last LDL 107, goal less than 100.  Check FLP, consider adjust medication. Vitamin D deficiency: Last levels were low, did not need to take ergocalciferol, currently on OTCs she thinks 2000 units daily.  Recheck levels, further advised for results.   Vaccine advice: Had a flu shot, recommend to take a COVID-vaccine. Allergies: Saw the specialist, medications adjusted,   considering allergy shots RTC 09/2023 CPX

## 2023-06-04 NOTE — Progress Notes (Signed)
Subjective:    Patient ID: Wanda Herman, female    DOB: March 24, 1971, 52 y.o.   MRN: [id number]  DOS:  06/04/2023 Type of visit - description: f/u  Since the last office visit is doing well. Chronic medical problems addressed.  Denies headache, chest pain or difficulty breathing.   Review of Systems See above   Past Medical History:  Diagnosis Date   Breast mass, left 07/08/06   Breast mass, right 05/30/04   Fibroid 08/21/2009   Gestational diabetes 2005   Goiter    H/O candidiasis    H/O varicella    Headache(784.0)    Hypertension    Prediabetes 09/30/2014   Thyroiditis, autoimmune     Past Surgical History:  Procedure Laterality Date   ABDOMINAL HYSTERECTOMY  12/04/2011   HYSTERECTOMY ABDOMINAL; NO Oophorectomy---  Surgeon: Purcell Nails, MD;  Location: [facility];  Service: Gynecology;  Laterality: N/A;   CYSTOSCOPY  12/04/2011   Procedure: CYSTOSCOPY;  Surgeon: Purcell Nails, MD;  Location: [facility];  Service: Gynecology;  Laterality: N/A;   LASIK      Current Outpatient Medications  Medication Instructions   cholecalciferol (VITAMIN D3) 2,000 Units, Oral, Daily   cycloSPORINE (RESTASIS) 0.05 % ophthalmic emulsion 1 drop, 2 times daily   EPINEPHrine (EPIPEN 2-PAK) 0.3 mg, Intramuscular, As needed   famotidine (PEPCID) 40 mg, Oral, Daily   levocetirizine (XYZAL) 5 mg, Oral, Every evening   losartan (COZAAR) 25 mg, Oral, Daily   metFORMIN (GLUCOPHAGE-XR) 500 mg, Oral, 2 times daily with meals   Olopatadine HCl (PATADAY) 0.2 % SOLN 1 drop, Both Eyes, Daily PRN   Olopatadine-Mometasone (RYALTRIS) 665-25 MCG/ACT SUSP 2 sprays, Nasal, 2 times daily PRN   pravastatin (PRAVACHOL) 20 mg, Oral, Daily       Objective:   Physical Exam BP 126/80   Pulse 71   Temp 98.1 F (36.7 C) (Oral)   Resp 16   Ht 5' 4.5" (1.638 m)   Wt 174 lb 9 oz (79.2 kg)   LMP 11/22/2011   SpO2 97%   BMI 29.50 kg/m  General:   Well developed, NAD, BMI noted. HEENT:  Normocephalic  . Face symmetric, atraumatic Lungs:  CTA B Normal respiratory effort, no intercostal retractions, no accessory muscle use. Heart: RRR,  no murmur.  Lower extremities: no pretibial edema bilaterally  Skin: Not pale. Not jaundice Neurologic:  alert & oriented X3.  Speech normal, gait appropriate for age and unassisted Psych--  Cognition and judgment appear intact.  Cooperative with normal attention span and concentration.  Behavior appropriate. No anxious or depressed appearing.      Assessment    Assessment DM was intolerant to metformin (2016), able to tolerate extended release HTN Hyperlipidemia Reactive airway dz? Never rx a inhaler as off 01-2016 Thyroid: h/o Hashimoto, saw endo 10/2020, rec TFT q year and f/u prn    Migraines  Dyspepsia -- on zantac Dry eyes-- sees eye doctor x 2/ year  Allergies  PLAN: DM: Doing well with diet, has increased her vigorous physical activity, on metformin without apparent side effects, check A1c HTN: No ambulatory BPs, BP at today's excellent, continue losartan, check a BMP. High cholesterol: On Pravachol, last LDL 107, goal less than 100.  Check FLP, consider adjust medication. Vitamin D deficiency: Last levels were low, did not need to take ergocalciferol, currently on OTCs she thinks 2000 units daily.  Recheck levels, further advised for results.   Vaccine advice: Had a flu shot, recommend  to take a COVID-vaccine. Allergies: Saw the specialist, medications adjusted,   considering allergy shots RTC 09/2023 CPX

## 2023-06-04 NOTE — Patient Instructions (Addendum)
Vaccines I recommend: Covid booster   GO TO THE LAB : Get the blood work     Next visit with me by 09/2023, complete physical exam    Please schedule it at the front desk     Per our records you are due for your diabetic eye exam. Please contact your eye doctor to schedule an appointment. Please have them send copies of your office visit notes to [place]. Our fax number is [phone]. If you need a referral to an eye doctor please let us know.

## 2023-06-09 MED ORDER — PRAVASTATIN SODIUM 40 MG PO TABS: 40.0000 mg | ORAL_TABLET | Freq: Every day | ORAL | 1 refills | Status: DC

## 2023-06-09 NOTE — Addendum Note (Signed)
Addended byConrad Providence D on: 06/09/2023 09:14 AM   Modules accepted: Orders

## 2023-06-11 NOTE — Progress Notes (Unsigned)
FOLLOW UP Date of Service/Encounter:  06/12/23  Subjective:  Wanda Herman (DOB: 23-Jul-1971) is a 52 y.o. female who [facility] in re-evaluation of the following: Exercise-induced bronchospasm, allergic rhinoconjunctivitis, adverse reaction to oysters, history of bee sting reaction History obtained from: chart review and patient.  For Review, LV was on 03/12/23  with Dr.Alyene Predmore seen for intial visit for chronic rhinitis, exercise induced shortness of breath, history of bee sting reaction, history of reaction to shellfish . See below for summary of history and diagnostics.  ----------------------------------------------------- Pertinent History/Diagnostics:  Asthma: mild intermittent does have occasional difficulties breathing when younger and did play sports, she does not have an inhaler, occasionally will have these symptoms but is not exercising often. Does remember having wheezing when younger. Will occasionally have wheezing if taking the stairs. She holds herself back from exercise to avoid these symptoms.  - normal spirometry (03/12/23): ratio 0.76, 83% FEV1 -03/12/23: meds: albuterol PRN Allergic Rhinitis:   facial pressure, ear pressure, dry eyes, sneezing, and itchy eyes  Occurs year-round, used to be mostly fall and spring. No prior ENT surgeries. - SPT environmental panel (03/12/23): positive to weed pollen, tree pollen and dust mites. Negative on lab testing. Current meds: ryaltris, OTC AH, OTC allergy eye drops and cyclosporine eye drops Adverse Food Reaction:   oysters-upset stomach and vomiting, does eat shrimp and fish. Avoids all other shellfish.  - SPT select foods (03/12/23): negative to shellfish - labs 03/12/23: negative to shellfish Bee sting reaction- told by her mother she had a reaction in childhood  She does not carry an epipen. Has not been stung in adulthood. - labs 03/12/23: negative hymenoptera panel Other:  Reflux-takes pepcid 40 mg daily --------------------------------------------------- Today presents for follow-up. Discussed the use of AI scribe software for clinical note transcription with the patient, who gave verbal consent to proceed.  History of Present Illness   The patient, with a history of seasonal allergies, presents with a recurrent pattern of headaches, sinus pressure, and ear discomfort, particularly in the fall and winter seasons. She reports that the symptoms occur daily during these seasons and have been a consistent issue throughout her life. The patient has been using Ryaltris nasal spray, which she reports as effective in managing her symptoms, particularly ear discomfort. However, she notes that if she forgets to use the spray, she notices a significant increase in symptoms by the next day.  The patient also reports a history of breathing difficulties, which are worse in the summer and improve in the fall and winter. She has recently started walking more and is planning to increase her exercise regimen. She expresses some concern about how her breathing might be affected by increased physical activity.  In addition to the above, the patient has a history of dry eyes, for which she is using Restasis twice daily and over-the-counter Systane for dryness.   She also reports a past adverse reaction to oysters, which resulted in vomiting several hours after consumption. However, she continues to consume other types of shellfish, including shrimp and crab legs, without any adverse reactions.  Lastly, the patient has an EpiPen due to a past unclear reaction to a bee sting. She has not had a recent reaction, and her labs for bee sting allergies were negative. She also tested negative for shellfish allergies.   She is not interested in allergy injections at this time.    All medications reviewed by clinical staff and updated in  chart. No new pertinent medical or surgical history except  as noted in HPI.  ROS: All others negative except as noted per HPI.   Objective:  BP 130/80   Pulse 87   Temp 97.9 F (36.6 C) (Temporal)   Resp 16   LMP 11/22/2011   SpO2 97%  There is no height or weight on file to calculate BMI. Physical Exam: General Appearance:  Alert, cooperative, no distress, appears stated age  Head:  Normocephalic, without obvious abnormality, atraumatic  Eyes:  Conjunctiva clear, EOM's intact  Ears EACs normal bilaterally and normal TMs bilaterally  Nose: Nares normal, hypertrophic turbinates, normal mucosa, and no visible anterior polyps  Throat: Lips, tongue normal; teeth and gums normal, normal posterior oropharynx  Neck: Supple, symmetrical  Lungs:   clear to auscultation bilaterally, Respirations unlabored, no coughing  Heart:  regular rate and rhythm and no murmur, Appears well perfused  Extremities: No edema  Skin: Skin color, texture, turgor normal and no rashes or lesions on visualized portions of skin  Neurologic: No gross deficits   Labs:  Lab Orders  No laboratory test(s) ordered today    Assessment/Plan   Seasonal and Perennial Allergic Rhinitis: Partially controlled on medications, not interested in AIT at this time. - allergy testing positive to weed pollen, tree pollen and dust mites - Prevention:  - allergen avoidance when possible - consider allergy shots as long term control of your symptoms by teaching your immune system to be more tolerant of your allergy triggers - Symptom control: - Continue Ryaltris 1-2 sprays in each nostril twice a day as needed for nasal congestion/itchy nose - Continue Antihistamine: daily or daily as needed.   -Options include Zyrtec (Cetirizine) 10mg , Claritin (Loratadine) 10mg , Allegra (Fexofenadine) 180mg , or Xyzal (Levocetirinze) 5mg  - Can be purchased over-the-counter if not covered by insurance.  Allergic Conjunctivitis: Partially controlled with medications - Continue eye drops for dry  eyes  Adverse Food Reaction-vomiting hours after oyster consumption.  Eats other shellfish without issue.  Prefers to avoid.  Discussed FPIES as a possibility - avoid oysterds, can not rule out FPIES  Exercise intolerance-suspect exercise induced bronchospasm: at goal Needs spirometry at follow-up. - Rescue Inhaler: Albuterol (Proair/Ventolin) 2 puffs . Use  every 4-6 hours as needed for chest tightness, wheezing, or coughing.   Can also use 15 minutes prior to exercise if you have symptoms with activity. - Asthma is not controlled if:  - Symptoms are occurring >2 times a week OR  - >2 times a month nighttime awakenings  - You are requiring systemic steroids (prednisone/steroid injections) more than once per year  - Your require hospitalization for your asthma.  - Please call the clinic to schedule a follow up if these symptoms arise Avoid smoke exposure Stay up-to-date with your annual flu vaccines, COVID vaccines and pneumonia vaccines when indicated.  Adverse reaction to bee stings: unclear history, negative blood work, discussed skin testing vs carry epipen and avoidance as possible. She declines skin testing. - labs negative, consider skin testing. - please carry Epinephrine autoinjector at all times in case of accidental sting, to be used if stung and has SKIN AND ANY OTHER SYMPTOMS - for SKIN only, can take Benadryl 2 tsp (25 mg)  - recommend medical alert bracelet - practice avoidance measures as outlined below when possible   Follow up : 6 months, sooner if needed. It was a pleasure seeing you again in clinic today! Thank you for allowing me to participate in your  care.  Other: none  Tonny Bollman, MD  [facility] [employer] [facility]

## 2023-06-12 ENCOUNTER — Encounter: Payer: Self-pay | Admitting: Internal Medicine

## 2023-06-12 ENCOUNTER — Ambulatory Visit: Payer: BC Managed Care – PPO | Admitting: Internal Medicine

## 2023-06-12 VITALS — BP 130/80 | HR 87 | Temp 97.9°F | Resp 16

## 2023-06-12 DIAGNOSIS — T63441D Toxic effect of venom of bees, accidental (unintentional), subsequent encounter: Secondary | ICD-10-CM

## 2023-06-12 DIAGNOSIS — J302 Other seasonal allergic rhinitis: Secondary | ICD-10-CM

## 2023-06-12 DIAGNOSIS — T781XXD Other adverse food reactions, not elsewhere classified, subsequent encounter: Secondary | ICD-10-CM

## 2023-06-12 DIAGNOSIS — J3089 Other allergic rhinitis: Secondary | ICD-10-CM | POA: Diagnosis not present

## 2023-06-12 DIAGNOSIS — J45990 Exercise induced bronchospasm: Secondary | ICD-10-CM

## 2023-06-12 DIAGNOSIS — T781XXA Other adverse food reactions, not elsewhere classified, initial encounter: Secondary | ICD-10-CM

## 2023-06-12 DIAGNOSIS — H1013 Acute atopic conjunctivitis, bilateral: Secondary | ICD-10-CM | POA: Diagnosis not present

## 2023-06-12 MED ORDER — AZELASTINE-FLUTICASONE 137-50 MCG/ACT NA SUSP: 1.0000 | Freq: Two times a day (BID) | NASAL | 5 refills | Status: AC | PRN

## 2023-06-12 MED ORDER — ALBUTEROL SULFATE HFA 108 (90 BASE) MCG/ACT IN AERS: 2.0000 | INHALATION_SPRAY | Freq: Four times a day (QID) | RESPIRATORY_TRACT | 1 refills | Status: AC | PRN

## 2023-06-12 NOTE — Patient Instructions (Addendum)
Seasonal and Perennial Allergic Rhinitis: - allergy testing positive to weed pollen, tree pollen and dust mites - Prevention:  - allergen avoidance when possible - consider allergy shots as long term control of your symptoms by teaching your immune system to be more tolerant of your allergy triggers - Symptom control: - Continue Ryaltris 1-2 sprays in each nostril twice a day as needed for nasal congestion/itchy nose - Continue Antihistamine: daily or daily as needed.   -Options include Zyrtec (Cetirizine) 10mg , Claritin (Loratadine) 10mg , Allegra (Fexofenadine) 180mg , or Xyzal (Levocetirinze) 5mg  - Can be purchased over-the-counter if not covered by insurance.  Allergic Conjunctivitis:  - Continue eye drops for dry eyes  Adverse Food Reaction - avoid oysterds, can not rule out FPIES  Exercise intolerance-suspect exercise induced bronchospasm: - Rescue Inhaler: Albuterol (Proair/Ventolin) 2 puffs . Use  every 4-6 hours as needed for chest tightness, wheezing, or coughing.   Can also use 15 minutes prior to exercise if you have symptoms with activity. - Asthma is not controlled if:  - Symptoms are occurring >2 times a week OR  - >2 times a month nighttime awakenings  - You are requiring systemic steroids (prednisone/steroid injections) more than once per year  - Your require hospitalization for your asthma.  - Please call the clinic to schedule a follow up if these symptoms arise Avoid smoke exposure Stay up-to-date with your annual flu vaccines, COVID vaccines and pneumonia vaccines when indicated.  Adverse reaction to bee stings:  - labs negative, consider skin testing. - please carry Epinephrine autoinjector at all times in case of accidental sting, to be used if stung and has SKIN AND ANY OTHER SYMPTOMS - for SKIN only, can take Benadryl 2 tsp (25 mg)  - recommend medical alert bracelet - practice avoidance measures as outlined below when possible   Follow up : 6 months,  sooner if needed. It was a pleasure seeing you again in clinic today! Thank you for allowing me to participate in your care.  Tonny Bollman, MD [facility]

## 2023-06-13 ENCOUNTER — Other Ambulatory Visit (HOSPITAL_COMMUNITY): Payer: Self-pay

## 2023-06-13 ENCOUNTER — Telehealth: Payer: Self-pay

## 2023-06-13 NOTE — Telephone Encounter (Signed)
*  Asthma/Allergy  Pharmacy Patient Advocate Encounter  Received notification from [facility] that Prior Authorization for Azelastine-Fluticasone 137-50MCG/ACT suspension  has been APPROVED from 06/13/2023 to 06/12/2024. Ran test claim, Copay is $21.87. This test claim was processed through [facility]- copay amounts may vary at other pharmacies due to pharmacy/plan contracts, or as the patient moves through the different stages of their insurance plan.   PA #/Case ID/Reference #: [id number]

## 2023-07-24 DIAGNOSIS — Z6831 Body mass index (BMI) 31.0-31.9, adult: Secondary | ICD-10-CM | POA: Diagnosis not present

## 2023-07-24 DIAGNOSIS — Z01419 Encounter for gynecological examination (general) (routine) without abnormal findings: Secondary | ICD-10-CM | POA: Diagnosis not present

## 2023-07-24 DIAGNOSIS — Z1231 Encounter for screening mammogram for malignant neoplasm of breast: Secondary | ICD-10-CM | POA: Diagnosis not present

## 2023-07-24 LAB — HM MAMMOGRAPHY

## 2023-08-05 DIAGNOSIS — R9230 Dense breasts, unspecified: Secondary | ICD-10-CM | POA: Diagnosis not present

## 2023-08-08 ENCOUNTER — Telehealth: Payer: Self-pay | Admitting: Internal Medicine

## 2023-08-08 ENCOUNTER — Other Ambulatory Visit: Payer: Self-pay

## 2023-08-08 DIAGNOSIS — N6019 Diffuse cystic mastopathy of unspecified breast: Secondary | ICD-10-CM | POA: Diagnosis not present

## 2023-08-08 MED ORDER — RYALTRIS 665-25 MCG/ACT NA SUSP: NASAL | 5 refills | Status: DC

## 2023-08-08 NOTE — Telephone Encounter (Signed)
Patient wants [place] to send a refill of her ryaltris to [facility] [address] [place]. She had it sent to a while ago from [facility] which was pretty expensive. Patient states they didn't have her insurance.

## 2023-08-08 NOTE — Telephone Encounter (Signed)
Patient called to get a refill on ryaltris

## 2023-08-08 NOTE — Telephone Encounter (Signed)
Sent in patients ryaltris to [facility] on [address]. Patient aware.

## 2023-08-08 NOTE — Telephone Encounter (Signed)
Sent refill of ryaltris to [facility] [address].patient aware.

## 2023-08-11 ENCOUNTER — Telehealth: Payer: Self-pay

## 2023-08-11 ENCOUNTER — Other Ambulatory Visit (HOSPITAL_COMMUNITY): Payer: Self-pay

## 2023-08-11 MED ORDER — RYALTRIS 665-25 MCG/ACT NA SUSP: NASAL | 5 refills | Status: AC

## 2023-08-11 NOTE — Telephone Encounter (Signed)
Pharmacy notified of approval...

## 2023-08-11 NOTE — Telephone Encounter (Signed)
Pharmacy Patient Advocate Encounter   Received notification from [employer] that prior authorization for Ryaltris 665-25MCG/ACT suspension is required/requested.   Insurance verification completed.   The patient is insured through [employer] .   Prior Authorization for Ryaltris 665-25MCG/ACT suspension has been APPROVED from 08-11-2023 to 08-09-2024   PA #/Case ID/Reference #: [id number]

## 2023-08-11 NOTE — Addendum Note (Signed)
Addended by: Berna Bue on: 08/11/2023 02:21 PM   Modules accepted: Orders

## 2023-08-21 ENCOUNTER — Other Ambulatory Visit: Payer: Self-pay | Admitting: Internal Medicine

## 2023-08-22 ENCOUNTER — Other Ambulatory Visit: Payer: Self-pay

## 2023-08-22 MED ORDER — METFORMIN HCL ER 500 MG PO TB24: 500.0000 mg | ORAL_TABLET | Freq: Two times a day (BID) | ORAL | 1 refills | Status: DC

## 2023-09-01 LAB — HM DIABETES EYE EXAM

## 2023-09-30 ENCOUNTER — Encounter: Payer: Self-pay | Admitting: Internal Medicine

## 2023-10-01 ENCOUNTER — Encounter: Payer: Self-pay | Admitting: Internal Medicine

## 2023-10-01 ENCOUNTER — Ambulatory Visit: Payer: BC Managed Care – PPO | Admitting: Internal Medicine

## 2023-10-01 VITALS — BP 124/80 | HR 68 | Temp 98.1°F | Resp 16 | Ht 64.5 in | Wt 178.1 lb

## 2023-10-01 DIAGNOSIS — E119 Type 2 diabetes mellitus without complications: Secondary | ICD-10-CM | POA: Diagnosis not present

## 2023-10-01 DIAGNOSIS — E063 Autoimmune thyroiditis: Secondary | ICD-10-CM

## 2023-10-01 DIAGNOSIS — I10 Essential (primary) hypertension: Secondary | ICD-10-CM | POA: Diagnosis not present

## 2023-10-01 DIAGNOSIS — Z23 Encounter for immunization: Secondary | ICD-10-CM | POA: Diagnosis not present

## 2023-10-01 DIAGNOSIS — Z0000 Encounter for general adult medical examination without abnormal findings: Secondary | ICD-10-CM

## 2023-10-01 DIAGNOSIS — Z7984 Long term (current) use of oral hypoglycemic drugs: Secondary | ICD-10-CM

## 2023-10-01 DIAGNOSIS — E785 Hyperlipidemia, unspecified: Secondary | ICD-10-CM

## 2023-10-01 LAB — BASIC METABOLIC PANEL
BUN: 13 mg/dL (ref 6–23)
CO2: 28 meq/L (ref 19–32)
Calcium: 9.4 mg/dL (ref 8.4–10.5)
Chloride: 100 meq/L (ref 96–112)
Creatinine, Ser: 0.87 mg/dL (ref 0.40–1.20)
GFR: 76.24 mL/min (ref >60.00)
Glucose, Bld: 101 mg/dL — ABNORMAL HIGH (ref 70–99)
Potassium: 4.4 meq/L (ref 3.5–5.1)
Sodium: 137 meq/L (ref 135–145)

## 2023-10-01 LAB — LIPID PANEL
Cholesterol: 184 mg/dL (ref 0–200)
HDL: 75.8 mg/dL (ref >39.00)
LDL Cholesterol: 84 mg/dL (ref 0–99)
NonHDL: 108.44
Total CHOL/HDL Ratio: 2
Triglycerides: 123 mg/dL (ref 0.0–149.0)
VLDL: 24.6 mg/dL (ref 0.0–40.0)

## 2023-10-01 LAB — CBC WITH DIFFERENTIAL/PLATELET
Basophils Absolute: 0.1 10*3/uL (ref 0.0–0.1)
Basophils Relative: 1.9 % (ref 0.0–3.0)
Eosinophils Absolute: 0.2 10*3/uL (ref 0.0–0.7)
Eosinophils Relative: 4.1 % (ref 0.0–5.0)
HCT: 41.2 % (ref 36.0–46.0)
Hemoglobin: 13.8 g/dL (ref 12.0–15.0)
Lymphocytes Relative: 46 % (ref 12.0–46.0)
Lymphs Abs: 2.1 10*3/uL (ref 0.7–4.0)
MCHC: 33.6 g/dL (ref 30.0–36.0)
MCV: 91.3 fL (ref 78.0–100.0)
Monocytes Absolute: 0.3 10*3/uL (ref 0.1–1.0)
Monocytes Relative: 7.1 % (ref 3.0–12.0)
Neutro Abs: 1.9 10*3/uL (ref 1.4–7.7)
Neutrophils Relative %: 40.9 % — ABNORMAL LOW (ref 43.0–77.0)
Platelets: 370 10*3/uL (ref 150.0–400.0)
RBC: 4.51 Mil/uL (ref 3.87–5.11)
RDW: 13.1 % (ref 11.5–15.5)
WBC: 4.6 10*3/uL (ref 4.0–10.5)

## 2023-10-01 LAB — AST: AST: 18 U/L (ref 0–37)

## 2023-10-01 LAB — T4, FREE: Free T4: 0.67 ng/dL (ref 0.60–1.60)

## 2023-10-01 LAB — ALT: ALT: 15 U/L (ref 0–35)

## 2023-10-01 LAB — T3, FREE: T3, Free: 3.4 pg/mL (ref 2.3–4.2)

## 2023-10-01 LAB — TSH: TSH: 3.2 u[IU]/mL (ref 0.35–5.50)

## 2023-10-01 LAB — HEMOGLOBIN A1C: Hgb A1c MFr Bld: 6.6 % — ABNORMAL HIGH (ref 4.6–6.5)

## 2023-10-01 NOTE — Progress Notes (Unsigned)
Subjective:    Patient ID: Wanda Herman, female    DOB: 1971-04-17, 53 y.o.   MRN: [id number]  DOS:  10/01/2023 Type of visit - description: CPX  Here for CPX Since the last office visit is doing well.  Has no concerns. Has diabetes, denies lower extremity paresthesias Review of Systems See above   Past Medical History:  Diagnosis Date   Breast mass, left 07/08/06   Breast mass, right 05/30/04   Fibroid 08/21/2009   Gestational diabetes 2005   Goiter    H/O candidiasis    H/O varicella    Headache(784.0)    Hypertension    Prediabetes 09/30/2014   Thyroiditis, autoimmune     Past Surgical History:  Procedure Laterality Date   ABDOMINAL HYSTERECTOMY  12/04/2011   HYSTERECTOMY ABDOMINAL; NO Oophorectomy---  Surgeon: Purcell Nails, MD;  Location: [facility];  Service: Gynecology;  Laterality: N/A;   CYSTOSCOPY  12/04/2011   Procedure: CYSTOSCOPY;  Surgeon: Purcell Nails, MD;  Location: [facility];  Service: Gynecology;  Laterality: N/A;   LASIK      Current Outpatient Medications  Medication Instructions   albuterol (VENTOLIN HFA) 108 (90 Base) MCG/ACT inhaler 2 puffs, Inhalation, Every 6 hours PRN   Azelastine-Fluticasone 137-50 MCG/ACT SUSP 1 spray, Nasal, 2 times daily PRN   cholecalciferol (VITAMIN D3) 2,000 Units, Daily   cycloSPORINE (RESTASIS) 0.05 % ophthalmic emulsion 1 drop, 2 times daily   EPINEPHrine (EPIPEN 2-PAK) 0.3 mg, Intramuscular, As needed   famotidine (PEPCID) 40 mg, Oral, Daily   levocetirizine (XYZAL) 5 mg, Oral, Every evening   losartan (COZAAR) 25 mg, Oral, Daily   metFORMIN (GLUCOPHAGE-XR) 500 mg, Oral, 2 times daily with meals   Olopatadine-Mometasone (RYALTRIS) 665-25 MCG/ACT SUSP Take 1-2 sprays in each nostril twice daily   pravastatin (PRAVACHOL) 40 mg, Oral, Daily at bedtime       Objective:   Physical Exam BP 124/80   Pulse 68   Temp 98.1 F (36.7 C) (Oral)   Resp 16   Ht 5' 4.5" (1.638 m)   Wt 178 lb 2 oz (80.8 kg)   LMP  11/22/2011   SpO2 98%   BMI 30.10 kg/m  General: Well developed, NAD, BMI noted Neck: Palpable thyroid, nontender. HEENT:  Normocephalic . Face symmetric, atraumatic Lungs:  CTA B Normal respiratory effort, no intercostal retractions, no accessory muscle use. Heart: RRR,  no murmur.  Abdomen:  Not distended, soft, non-tender. No rebound or rigidity.   DM foot exam: No edema, good pedal pulses, pinprick examination normal Skin: Exposed areas without rash. Not pale. Not jaundice Neurologic:  alert & oriented X3.  Speech normal, gait appropriate for age and unassisted Strength symmetric and appropriate for age.  Psych: Cognition and judgment appear intact.  Cooperative with normal attention span and concentration.  Behavior appropriate. No anxious or depressed appearing.     Assessment     Assessment DM was intolerant to metformin (2016), able to tolerate extended release HTN Hyperlipidemia Thyroid: h/o Hashimoto, saw endo 10/2020, rec TFT q year and f/u prn    Migraines  Dyspepsia -- on zantac Dry eyes-- sees eye doctor x 2/ year  Allergies, exercise-induced bronchospasm Sees dermatology regularly for skin checks.  PLAN: Here for CPX  - Tdap:2015  -  Shingrex x2. - PNM 20: Today (history of diabetes) - Had a flu shot and a COVID-vaccine --Female care per Dr Su Hilt.  MMG: 07-2023 per pt   (K PN). Perimenopausal  --CCS:  Had a colonoscopy 08/04/2020. Next in 10 years  --Diet and exercise: Encouraged routine exercise and a healthy diabetic diet. -Labs:  AST ALT FLP CBC A1c TSH free T3 free T4 Other issues addressed. DM:On metformin, check A1c.  Feet exam negative. HTN: BP looks very good, continue losartan, check labs. High cholesterol: Based on last FLP, Pravachol increased to 40 mg, checking labs.  Hashimoto's, history of colon LOV endocrinology 03/2021, plan if TFTs yearly and follow-up with Endo as needed.  Check TFTs today. RTC 6 months       ==== DM:  Doing well with diet, has increased her vigorous physical activity, on metformin without apparent side effects, check A1c HTN: No ambulatory BPs, BP at today's excellent, continue losartan, check a BMP. High cholesterol: On Pravachol, last LDL 107, goal less than 100.  Check FLP, consider adjust medication. Vitamin D deficiency: Last levels were low, did not need to take ergocalciferol, currently on OTCs she thinks 2000 units daily.  Recheck levels, further advised for results.   Vaccine advice: Had a flu shot, recommend to take a COVID-vaccine. Allergies: Saw the specialist, medications adjusted,   considering allergy shots RTC 09/2023 CPX

## 2023-10-01 NOTE — Patient Instructions (Addendum)
Check the  blood pressure regularly Blood pressure goal:  between 110/65 and  135/85. If it is consistently higher or lower, let me know     GO TO THE LAB : Get the blood work     Next visit with me in 6 months    Please schedule it at the front desk

## 2023-10-02 ENCOUNTER — Encounter: Payer: Self-pay | Admitting: Internal Medicine

## 2023-10-02 LAB — MICROALBUMIN / CREATININE URINE RATIO
Creatinine,U: 205.9 mg/dL
Microalb Creat Ratio: 11.1 mg/g (ref 0.0–30.0)
Microalb, Ur: 2.3 mg/dL — ABNORMAL HIGH (ref 0.0–1.9)

## 2023-10-02 NOTE — Assessment & Plan Note (Signed)
Here for CPX - Tdap:2015  -  Shingrex x2. - PNM 20: Today (history of diabetes) - Had a flu shot and a COVID-vaccine --Female care per Dr Su Hilt.  MMG: 07-2023 per pt   (K PN). --CCS: Had a colonoscopy 08/04/2020. Next in 10 years  --Diet and exercise: Encouraged routine exercise and a healthy diabetic diet. -Labs:  AST ALT FLP CBC A1c TSH free T3 free T4

## 2023-10-02 NOTE — Assessment & Plan Note (Signed)
Here for CPX  Other issues addressed. DM:On metformin, check A1c.  Feet exam negative. HTN: BP looks very good, continue losartan, check labs. High cholesterol: Based on last FLP, Pravachol increased to 40 mg, checking labs. Hashimoto's, history of , LOV endocrinology 03/2021, plan if TFTs yearly and follow-up with Endo as needed.  Check TFTs today. RTC 6 months

## 2023-10-03 ENCOUNTER — Encounter: Payer: Self-pay | Admitting: Internal Medicine

## 2023-10-05 ENCOUNTER — Encounter: Payer: Self-pay | Admitting: Internal Medicine

## 2023-10-08 ENCOUNTER — Ambulatory Visit: Payer: BC Managed Care – PPO | Admitting: Internal Medicine

## 2023-10-23 ENCOUNTER — Telehealth: Payer: Self-pay

## 2023-10-23 ENCOUNTER — Ambulatory Visit: Payer: Self-pay | Admitting: Internal Medicine

## 2023-10-23 NOTE — Telephone Encounter (Signed)
 Pt needs to be seen in person please.

## 2023-10-23 NOTE — Telephone Encounter (Signed)
 Copied from CRM [id number]. Topic: General - Other >> Oct 23, 2023  8:38 AM Truddie Crumble wrote: Reason for CRM: patient called stating she saw something in her urine and does not know if she need to come in or not and do labs

## 2023-10-23 NOTE — Telephone Encounter (Signed)
 Copied from CRM [id number]. Topic: Clinical - Red Word Triage >> Oct 23, 2023  3:59 PM Deaijah H wrote: Red Word that prompted transfer to Nurse Triage: Possible blood in urine.  Chief Complaint: blood in urine Symptoms: blood in urine Frequency: started last night Pertinent Negatives: Patient denies flank pain, pain with urination Disposition: [] ED /[] Urgent Care (no appt availability in office) / [x] Appointment(In office/virtual)/ []  [facility]/ [] Home Care/ [] Refused Recommended Disposition /[] [place] [facility]/ []  Follow-up with PCP Additional Notes: apt scheduled for tomorrow; instructed to go to er if becomes worse.  Reason for Disposition  Urinating more frequently than usual (i.e., frequency)  Answer Assessment - Initial Assessment Questions 1. SYMPTOM: "What's the main symptom you're concerned about?" (e.g., frequency, incontinence)     Blood in urine 2. ONSET: "When did the  bleeding  start?"     Last night 3. PAIN: "Is there any pain?" If Yes, ask: "How bad is it?" (Scale: 1-10; mild, moderate, severe)     denies 4. CAUSE: "What do you think is causing the symptoms?"     unknown 5. OTHER SYMPTOMS: "Do you have any other symptoms?" (e.g., blood in urine, fever, flank pain, pain with urination)     Blood in urine 6. PREGNANCY: "Is there any chance you are pregnant?" "When was your last menstrual period?"     na  Protocols used: Urinary Symptoms-A-AH

## 2023-10-24 ENCOUNTER — Encounter: Payer: Self-pay | Admitting: Physician Assistant

## 2023-10-24 ENCOUNTER — Ambulatory Visit: Admitting: Physician Assistant

## 2023-10-24 VITALS — BP 135/84 | HR 72 | Ht 64.5 in | Wt 174.0 lb

## 2023-10-24 DIAGNOSIS — R310 Gross hematuria: Secondary | ICD-10-CM | POA: Diagnosis not present

## 2023-10-24 DIAGNOSIS — R319 Hematuria, unspecified: Secondary | ICD-10-CM

## 2023-10-24 LAB — URINALYSIS, MICROSCOPIC ONLY

## 2023-10-24 LAB — POC URINALSYSI DIPSTICK (AUTOMATED)
Bilirubin, UA: NEGATIVE
Blood, UA: NEGATIVE
Glucose, UA: NEGATIVE
Ketones, UA: NEGATIVE
Nitrite, UA: NEGATIVE
Protein, UA: NEGATIVE
Spec Grav, UA: 1.015 (ref 1.010–1.025)
Urobilinogen, UA: 0.2 U/dL
pH, UA: 5 (ref 5.0–8.0)

## 2023-10-24 NOTE — Progress Notes (Signed)
 Established patient visit   Patient: Wanda Herman   DOB: 27-Jan-1971   53 y.o. Female  MRN: [id number] Visit Date: 10/24/2023  Today's healthcare provider: Alfredia Ferguson, PA-C   Cc. Blood in urine  Subjective     Pt reports seeing something that looks like blood in her urine, and when wiping x 2 days ago. She also noticed yesterday. Last week she had a GI illness, which is what prompted her to check her urine. Some residual abdominal queasiness but no pain. No other urinary symptoms. Pt is s/p hysterectomy.   Medications: Outpatient Medications Prior to Visit  Medication Sig   albuterol (VENTOLIN HFA) 108 (90 Base) MCG/ACT inhaler Inhale 2 puffs into the lungs every 6 (six) hours as needed for wheezing or shortness of breath.   Azelastine-Fluticasone 137-50 MCG/ACT SUSP Place 1 spray into the nose 2 (two) times daily as needed.   cholecalciferol (VITAMIN D3) 25 MCG (1000 UNIT) tablet Take 2,000 Units by mouth daily. (Patient not taking: Reported on 10/01/2023)   cycloSPORINE (RESTASIS) 0.05 % ophthalmic emulsion 1 drop 2 (two) times daily.   EPINEPHrine (EPIPEN 2-PAK) 0.3 mg/0.3 mL IJ SOAJ injection Inject 0.3 mg into the muscle as needed for anaphylaxis. (Patient not taking: Reported on 10/01/2023)   famotidine (PEPCID) 40 MG tablet Take 1 tablet (40 mg total) by mouth daily.   levocetirizine (XYZAL) 5 MG tablet Take 1 tablet (5 mg total) by mouth every evening.   losartan (COZAAR) 25 MG tablet Take 1 tablet (25 mg total) by mouth daily.   metFORMIN (GLUCOPHAGE-XR) 500 MG 24 hr tablet Take 1 tablet (500 mg total) by mouth 2 (two) times daily with a meal.   Olopatadine-Mometasone (RYALTRIS) 665-25 MCG/ACT SUSP Take 1-2 sprays in each nostril twice daily   pravastatin (PRAVACHOL) 40 MG tablet Take 1 tablet (40 mg total) by mouth at bedtime.   No facility-administered medications prior to visit.    Review of Systems  Constitutional:  Negative for fatigue and fever.   Respiratory:  Negative for cough and shortness of breath.   Cardiovascular:  Negative for chest pain and leg swelling.  Gastrointestinal:  Negative for abdominal pain.  Genitourinary:  Positive for hematuria.  Neurological:  Negative for dizziness and headaches.       Objective    BP 135/84   Pulse 72   Ht 5' 4.5" (1.638 m)   Wt 174 lb (78.9 kg)   LMP 11/22/2011   SpO2 99%   BMI 29.41 kg/m    Physical Exam Vitals reviewed.  Constitutional:      Appearance: She is not ill-appearing.  HENT:     Head: Normocephalic.  Eyes:     Conjunctiva/sclera: Conjunctivae normal.  Cardiovascular:     Rate and Rhythm: Normal rate.  Pulmonary:     Effort: Pulmonary effort is normal. No respiratory distress.  Neurological:     Mental Status: She is alert and oriented to person, place, and time.  Psychiatric:        Mood and Affect: Mood normal.        Behavior: Behavior normal.     Results for orders placed or performed in visit on 10/24/23  POCT Urinalysis Dipstick (Automated)  Result Value Ref Range   Color, UA yellow    Clarity, UA clear    Glucose, UA Negative Negative   Bilirubin, UA neg    Ketones, UA neg    Spec Grav, UA 1.015 1.010 - 1.025  Blood, UA neg    pH, UA 5.0 5.0 - 8.0   Protein, UA Negative Negative   Urobilinogen, UA 0.2 0.2 or 1.0 E.U./dL   Nitrite, UA neg    Leukocytes, UA Trace (A) Negative    Assessment & Plan    Hematuria, unspecified type -     POCT Urinalysis Dipstick (Automated)  Gross hematuria -     Urine Culture -     Urine Microscopic   UA w/ only trace leuks, neg for blood. Will send for culture, urine micro. Advised pt to continue to monitor.  Return if symptoms worsen or fail to improve.       Alfredia Ferguson, PA-C  [facility] [phone] (phone) [phone] (fax)  [facility]

## 2023-10-24 NOTE — Telephone Encounter (Signed)
Appt today w/ Lillia Abed.

## 2023-10-25 LAB — URINE CULTURE
MICRO NUMBER:: 16173532
Result:: NO GROWTH
SPECIMEN QUALITY:: ADEQUATE

## 2023-10-27 ENCOUNTER — Encounter: Payer: Self-pay | Admitting: Physician Assistant

## 2023-11-03 ENCOUNTER — Other Ambulatory Visit: Payer: Self-pay | Admitting: Internal Medicine

## 2023-12-17 ENCOUNTER — Ambulatory Visit: Payer: BC Managed Care – PPO | Admitting: Internal Medicine

## 2024-02-02 ENCOUNTER — Other Ambulatory Visit: Payer: Self-pay | Admitting: Internal Medicine

## 2024-02-05 DIAGNOSIS — R928 Other abnormal and inconclusive findings on diagnostic imaging of breast: Secondary | ICD-10-CM | POA: Diagnosis not present

## 2024-02-12 DIAGNOSIS — R928 Other abnormal and inconclusive findings on diagnostic imaging of breast: Secondary | ICD-10-CM | POA: Diagnosis not present

## 2024-03-29 ENCOUNTER — Ambulatory Visit: Payer: BC Managed Care – PPO | Admitting: Internal Medicine

## 2024-03-29 ENCOUNTER — Encounter: Payer: Self-pay | Admitting: Internal Medicine

## 2024-03-29 VITALS — BP 128/80 | HR 65 | Temp 98.2°F | Resp 12 | Ht 64.5 in | Wt 171.2 lb

## 2024-03-29 DIAGNOSIS — E119 Type 2 diabetes mellitus without complications: Secondary | ICD-10-CM | POA: Diagnosis not present

## 2024-03-29 DIAGNOSIS — M5410 Radiculopathy, site unspecified: Secondary | ICD-10-CM

## 2024-03-29 DIAGNOSIS — I10 Essential (primary) hypertension: Secondary | ICD-10-CM | POA: Diagnosis not present

## 2024-03-29 DIAGNOSIS — Z7984 Long term (current) use of oral hypoglycemic drugs: Secondary | ICD-10-CM | POA: Diagnosis not present

## 2024-03-29 LAB — MICROALBUMIN / CREATININE URINE RATIO
Creatinine,U: 104.7 mg/dL
Microalb Creat Ratio: UNDETERMINED mg/g (ref 0.0–30.0)
Microalb, Ur: <0.7 mg/dL

## 2024-03-29 LAB — BASIC METABOLIC PANEL WITH GFR
BUN: 18 mg/dL (ref 6–23)
CO2: 27 meq/L (ref 19–32)
Calcium: 9.4 mg/dL (ref 8.4–10.5)
Chloride: 102 meq/L (ref 96–112)
Creatinine, Ser: 0.77 mg/dL (ref 0.40–1.20)
GFR: 87.96 mL/min (ref >60.00)
Glucose, Bld: 110 mg/dL — ABNORMAL HIGH (ref 70–99)
Potassium: 4.4 meq/L (ref 3.5–5.1)
Sodium: 138 meq/L (ref 135–145)

## 2024-03-29 LAB — HEMOGLOBIN A1C: Hgb A1c MFr Bld: 7 % — ABNORMAL HIGH (ref 4.6–6.5)

## 2024-03-29 NOTE — Patient Instructions (Addendum)
 For the pain on the right leg: Take Tylenol  as needed, call if the pain is not completely gone in few weeks   Vaccines I recommend: Flu shot every fall Consider COVID booster.   Check the  blood pressure regularly Blood pressure goal:  between 110/65 and  135/85. If it is consistently higher or lower, let me know     GO TO THE LAB :  Get the blood work   Your results will be posted on MyChart with my comments  Next office visit for a physical exam in 6 months Please make an appointment before you leave today

## 2024-03-29 NOTE — Progress Notes (Signed)
   Subjective:    Patient ID: Wanda Herman, female    DOB: 08/11/1971, 53 y.o.   MRN: [id number]  DOS:  03/29/2024 Type of visit - description: Follow-up  Chronic medical problems addressed.  About 2 months ago developed pain from  the R low back and right hip to the R knee. The pain was somewhat steady, increased by sitting, lying down.  Described as burning. Did have a rash near the right hip, she thinks related to a heating pad. No motor deficits. Currently improved.     Review of Systems See above   Past Medical History:  Diagnosis Date   Breast mass, left 07/08/06   Breast mass, right 05/30/04   Fibroid 08/21/2009   Gestational diabetes 2005   Goiter    H/O candidiasis    H/O varicella    Headache(784.0)    Hypertension    Prediabetes 09/30/2014   Thyroiditis, autoimmune     Past Surgical History:  Procedure Laterality Date   ABDOMINAL HYSTERECTOMY  12/04/2011   HYSTERECTOMY ABDOMINAL; NO Oophorectomy---  Surgeon: Jon CINDERELLA Rummer, MD;  Location: [facility];  Service: Gynecology;  Laterality: N/A;   CYSTOSCOPY  12/04/2011   Procedure: CYSTOSCOPY;  Surgeon: Jon CINDERELLA Rummer, MD;  Location: [facility];  Service: Gynecology;  Laterality: N/A;   LASIK      Current Outpatient Medications  Medication Instructions   albuterol  (VENTOLIN  HFA) 108 (90 Base) MCG/ACT inhaler 2 puffs, Inhalation, Every 6 hours PRN   Azelastine -Fluticasone  137-50 MCG/ACT SUSP 1 spray, Nasal, 2 times daily PRN   cholecalciferol (VITAMIN D3) 2,000 Units, Daily   cycloSPORINE (RESTASIS) 0.05 % ophthalmic emulsion 1 drop, 2 times daily   EPINEPHrine  (EPIPEN  2-PAK) 0.3 mg, Intramuscular, As needed   famotidine  (PEPCID ) 40 mg, Oral, Daily   levocetirizine (XYZAL ) 5 mg, Oral, Every evening   losartan  (COZAAR ) 25 mg, Oral, Daily   metFORMIN  (GLUCOPHAGE -XR) 500 mg, Oral, 2 times daily with meals   Olopatadine -Mometasone (RYALTRIS ) 665-25 MCG/ACT SUSP Take 1-2 sprays in each nostril twice daily    pravastatin  (PRAVACHOL ) 40 mg, Oral, Daily at bedtime       Objective:   Physical Exam BP 128/80 (BP Location: Left Arm, Patient Position: Sitting, Cuff Size: Normal)   Pulse 65   Temp 98.2 F (36.8 C) (Oral)   Resp 12   Ht 5' 4.5 (1.638 m)   Wt 171 lb 3.2 oz (77.7 kg)   LMP 11/22/2011   SpO2 96%   BMI 28.93 kg/m  General:   Well developed, NAD, BMI noted. HEENT:  Normocephalic . Face symmetric, atraumatic MSK: No TTP at the lumbar spine or SI joints.  No TTP at the trochanteric bursa's DM foot exam: No edema, pinprick examination normal Skin: Has a hyperpigmented patch on the proximal aspect of the right hide Neurologic:  alert & oriented X3.  Speech normal, gait appropriate for age and unassisted. Motor, DTR symmetric Psych--  Cognition and judgment appear intact.  Cooperative with normal attention span and concentration.  Behavior appropriate. No anxious or depressed appearing.      Assessment   Assessment DM was intolerant to metformin  (2016), able to tolerate extended release HTN Hyperlipidemia Thyroid : h/o Hashimoto, saw endo 10/2020, rec TFT q year and f/u prn    Migraines  Dyspepsia -- on zantac  Dry eyes-- sees eye doctor x 2/ year  Allergies, exercise-induced bronchospasm Sees dermatology regularly for skin checks.  Perimenopausal   PLAN: Radiculopathy radiculopathy

## 2024-03-29 NOTE — Assessment & Plan Note (Signed)
 DM: Last A1c 6.6, on metformin , controlled.  Has lost some weight, doing well with diet, check A1c and micro. Foot exam: WNL. HTN: On losartan , BP looks good, recommend ambulatory BPs.  Check a BMP. High cholesterol: Last LDL 84, goal 70, on pravastatin  40 mg daily.  No change, continue working on a better diet, recheck on RTC. Radiculopathy?  New issue.  Pain at the right leg as described above, question of radiculopathy,  DDx includes meralgia paresthetica.  Overall improving.  Recommend observation, Tylenol . Preventive care: Recommend flu and COVID booster this fall RTC 6 months

## 2024-03-30 ENCOUNTER — Ambulatory Visit: Payer: Self-pay | Admitting: Internal Medicine

## 2024-03-30 DIAGNOSIS — L578 Other skin changes due to chronic exposure to nonionizing radiation: Secondary | ICD-10-CM | POA: Diagnosis not present

## 2024-03-30 DIAGNOSIS — L814 Other melanin hyperpigmentation: Secondary | ICD-10-CM | POA: Diagnosis not present

## 2024-03-30 DIAGNOSIS — D225 Melanocytic nevi of trunk: Secondary | ICD-10-CM | POA: Diagnosis not present

## 2024-03-30 DIAGNOSIS — L820 Inflamed seborrheic keratosis: Secondary | ICD-10-CM | POA: Diagnosis not present

## 2024-03-30 DIAGNOSIS — L821 Other seborrheic keratosis: Secondary | ICD-10-CM | POA: Diagnosis not present

## 2024-03-31 MED ORDER — METFORMIN HCL ER 500 MG PO TB24: 1000.0000 mg | ORAL_TABLET | Freq: Two times a day (BID) | ORAL | 1 refills | Status: DC

## 2024-04-29 ENCOUNTER — Other Ambulatory Visit: Payer: Self-pay | Admitting: Internal Medicine

## 2024-04-29 DIAGNOSIS — T7840XA Allergy, unspecified, initial encounter: Secondary | ICD-10-CM

## 2024-05-14 ENCOUNTER — Other Ambulatory Visit: Payer: Self-pay | Admitting: Internal Medicine

## 2024-05-17 DIAGNOSIS — B079 Viral wart, unspecified: Secondary | ICD-10-CM | POA: Diagnosis not present

## 2024-05-17 DIAGNOSIS — D485 Neoplasm of uncertain behavior of skin: Secondary | ICD-10-CM | POA: Diagnosis not present

## 2024-06-15 ENCOUNTER — Other Ambulatory Visit: Payer: Self-pay | Admitting: Internal Medicine

## 2024-08-02 ENCOUNTER — Other Ambulatory Visit: Payer: Self-pay | Admitting: Internal Medicine

## 2024-08-18 DIAGNOSIS — K219 Gastro-esophageal reflux disease without esophagitis: Secondary | ICD-10-CM | POA: Diagnosis not present

## 2024-08-18 DIAGNOSIS — H16143 Punctate keratitis, bilateral: Secondary | ICD-10-CM | POA: Diagnosis not present

## 2024-08-18 DIAGNOSIS — Z01419 Encounter for gynecological examination (general) (routine) without abnormal findings: Secondary | ICD-10-CM | POA: Diagnosis not present

## 2024-08-18 DIAGNOSIS — N951 Menopausal and female climacteric states: Secondary | ICD-10-CM | POA: Diagnosis not present

## 2024-08-18 DIAGNOSIS — R928 Other abnormal and inconclusive findings on diagnostic imaging of breast: Secondary | ICD-10-CM | POA: Diagnosis not present

## 2024-08-18 DIAGNOSIS — H16223 Keratoconjunctivitis sicca, not specified as Sjogren's, bilateral: Secondary | ICD-10-CM | POA: Diagnosis not present

## 2024-08-18 DIAGNOSIS — Z5181 Encounter for therapeutic drug level monitoring: Secondary | ICD-10-CM | POA: Diagnosis not present

## 2024-08-18 DIAGNOSIS — Z1231 Encounter for screening mammogram for malignant neoplasm of breast: Secondary | ICD-10-CM | POA: Diagnosis not present

## 2024-10-11 ENCOUNTER — Encounter: Admitting: Internal Medicine
